# Patient Record
Sex: Female | Born: 1970 | Race: White | Hispanic: No | Marital: Married | Smoking: Current some day smoker
Health system: Southern US, Community
[De-identification: ages and names within clinical notes are randomized; demographics above are authoritative.]

## PROBLEM LIST (undated history)

## (undated) DIAGNOSIS — F329 Major depressive disorder, single episode, unspecified: Secondary | ICD-10-CM

## (undated) DIAGNOSIS — M1990 Unspecified osteoarthritis, unspecified site: Secondary | ICD-10-CM

## (undated) DIAGNOSIS — M722 Plantar fascial fibromatosis: Secondary | ICD-10-CM

## (undated) DIAGNOSIS — I10 Essential (primary) hypertension: Secondary | ICD-10-CM

## (undated) DIAGNOSIS — F32A Depression, unspecified: Secondary | ICD-10-CM

## (undated) DIAGNOSIS — F419 Anxiety disorder, unspecified: Secondary | ICD-10-CM

## (undated) HISTORY — DX: Major depressive disorder, single episode, unspecified: F32.9

## (undated) HISTORY — DX: Depression, unspecified: F32.A

## (undated) HISTORY — DX: Plantar fascial fibromatosis: M72.2

## (undated) HISTORY — DX: Anxiety disorder, unspecified: F41.9

## (undated) HISTORY — DX: Unspecified osteoarthritis, unspecified site: M19.90

## (undated) HISTORY — PX: DILATION AND CURETTAGE OF UTERUS: SHX78

## (undated) HISTORY — DX: Essential (primary) hypertension: I10

---

## 2010-11-06 ENCOUNTER — Ambulatory Visit: Payer: Self-pay | Admitting: Family Medicine

## 2010-11-29 ENCOUNTER — Ambulatory Visit: Payer: Self-pay | Admitting: Otolaryngology

## 2011-05-03 ENCOUNTER — Emergency Department: Payer: Self-pay | Admitting: Unknown Physician Specialty

## 2011-09-24 ENCOUNTER — Encounter: Payer: Self-pay | Admitting: Family Medicine

## 2011-09-24 DIAGNOSIS — Z01419 Encounter for gynecological examination (general) (routine) without abnormal findings: Secondary | ICD-10-CM

## 2012-07-20 DIAGNOSIS — I10 Essential (primary) hypertension: Secondary | ICD-10-CM | POA: Insufficient documentation

## 2012-07-20 DIAGNOSIS — E042 Nontoxic multinodular goiter: Secondary | ICD-10-CM | POA: Insufficient documentation

## 2012-07-20 DIAGNOSIS — R946 Abnormal results of thyroid function studies: Secondary | ICD-10-CM | POA: Insufficient documentation

## 2012-07-20 DIAGNOSIS — K219 Gastro-esophageal reflux disease without esophagitis: Secondary | ICD-10-CM | POA: Insufficient documentation

## 2012-09-04 ENCOUNTER — Emergency Department: Payer: Self-pay | Admitting: Emergency Medicine

## 2012-09-06 LAB — BETA STREP CULTURE(ARMC)

## 2015-03-02 ENCOUNTER — Other Ambulatory Visit: Payer: Self-pay | Admitting: Internal Medicine

## 2015-03-02 DIAGNOSIS — R059 Cough, unspecified: Secondary | ICD-10-CM

## 2015-03-02 DIAGNOSIS — R071 Chest pain on breathing: Secondary | ICD-10-CM

## 2015-03-02 DIAGNOSIS — R05 Cough: Secondary | ICD-10-CM

## 2015-03-07 ENCOUNTER — Other Ambulatory Visit: Payer: Self-pay

## 2015-03-09 ENCOUNTER — Inpatient Hospital Stay: Admission: RE | Admit: 2015-03-09 | Payer: Self-pay | Source: Ambulatory Visit

## 2015-08-17 ENCOUNTER — Ambulatory Visit (INDEPENDENT_AMBULATORY_CARE_PROVIDER_SITE_OTHER): Payer: Managed Care, Other (non HMO) | Admitting: Family Medicine

## 2015-08-17 ENCOUNTER — Ambulatory Visit
Admission: RE | Admit: 2015-08-17 | Discharge: 2015-08-17 | Disposition: A | Discharge status: Home / Self Care | Payer: Managed Care, Other (non HMO) | Source: Ambulatory Visit | Attending: Family Medicine | Admitting: Family Medicine

## 2015-08-17 ENCOUNTER — Encounter: Payer: Self-pay | Admitting: Family Medicine

## 2015-08-17 ENCOUNTER — Ambulatory Visit: Payer: Managed Care, Other (non HMO) | Admitting: Family Medicine

## 2015-08-17 VITALS — BP 120/70 | HR 80 | Ht 67.0 in | Wt 199.0 lb

## 2015-08-17 DIAGNOSIS — K219 Gastro-esophageal reflux disease without esophagitis: Secondary | ICD-10-CM | POA: Diagnosis not present

## 2015-08-17 DIAGNOSIS — I10 Essential (primary) hypertension: Secondary | ICD-10-CM

## 2015-08-17 DIAGNOSIS — E042 Nontoxic multinodular goiter: Secondary | ICD-10-CM

## 2015-08-17 DIAGNOSIS — F418 Other specified anxiety disorders: Secondary | ICD-10-CM | POA: Diagnosis not present

## 2015-08-17 DIAGNOSIS — M549 Dorsalgia, unspecified: Secondary | ICD-10-CM

## 2015-08-17 DIAGNOSIS — Z7689 Persons encountering health services in other specified circumstances: Secondary | ICD-10-CM

## 2015-08-17 DIAGNOSIS — F419 Anxiety disorder, unspecified: Secondary | ICD-10-CM

## 2015-08-17 DIAGNOSIS — Z7189 Other specified counseling: Secondary | ICD-10-CM

## 2015-08-17 DIAGNOSIS — G8929 Other chronic pain: Secondary | ICD-10-CM

## 2015-08-17 DIAGNOSIS — M545 Low back pain: Secondary | ICD-10-CM | POA: Diagnosis present

## 2015-08-17 DIAGNOSIS — Z01419 Encounter for gynecological examination (general) (routine) without abnormal findings: Secondary | ICD-10-CM

## 2015-08-17 DIAGNOSIS — F329 Major depressive disorder, single episode, unspecified: Secondary | ICD-10-CM

## 2015-08-17 MED ORDER — MELOXICAM 15 MG PO TABS: 15.0000 mg | ORAL_TABLET | Freq: Every day | ORAL | Status: DC

## 2015-08-17 MED ORDER — HYDROCHLOROTHIAZIDE 25 MG PO TABS: 25.0000 mg | ORAL_TABLET | Freq: Every day | ORAL | Status: DC

## 2015-08-17 MED ORDER — CITALOPRAM HYDROBROMIDE 10 MG PO TABS: 10.0000 mg | ORAL_TABLET | Freq: Every day | ORAL | Status: DC

## 2015-08-17 NOTE — Patient Instructions (Signed)
Duloxetine delayed-release capsules  What is this medicine?  DULOXETINE (doo LOX e teen) is used to treat depression, anxiety, and different types of chronic pain.  This medicine may be used for other purposes; ask your health care provider or pharmacist if you have questions.  What should I tell my health care provider before I take this medicine?  They need to know if you have any of these conditions:  -bipolar disorder or a family history of bipolar disorder  -glaucoma  -kidney disease  -liver disease  -suicidal thoughts or a previous suicide attempt  -taken medicines called MAOIs like Carbex, Eldepryl, Marplan, Nardil, and Parnate within 14 days  -an unusual reaction to duloxetine, other medicines, foods, dyes, or preservatives  -pregnant or trying to get pregnant  -breast-feeding  How should I use this medicine?  Take this medicine by mouth with a glass of water. Follow the directions on the prescription label. Do not cut, crush or chew this medicine. You can take this medicine with or without food. Take your medicine at regular intervals. Do not take your medicine more often than directed. Do not stop taking this medicine suddenly except upon the advice of your doctor. Stopping this medicine too quickly may cause serious side effects or your condition may worsen.  A special MedGuide will be given to you by the pharmacist with each prescription and refill. Be sure to read this information carefully each time.  Talk to your pediatrician regarding the use of this medicine in children. While this drug may be prescribed for children as young as 7 years of age for selected conditions, precautions do apply.  Overdosage: If you think you have taken too much of this medicine contact a poison control center or emergency room at once.  NOTE: This medicine is only for you. Do not share this medicine with others.  What if I miss a dose?  If you miss a dose, take it as soon as you can. If it is almost time for your next  dose, take only that dose. Do not take double or extra doses.  What may interact with this medicine?  Do not take this medicine with any of the following medications:  -certain diet drugs like dexfenfluramine, fenfluramine  -desvenlafaxine  -linezolid  -MAOIs like Azilect, Carbex, Eldepryl, Marplan, Nardil, and Parnate  -methylene blue (intravenous)  -milnacipran  -thioridazine  -venlafaxine  This medicine may also interact with the following medications:  -alcohol  -aspirin and aspirin-like medicines  -certain antibiotics like ciprofloxacin and enoxacin  -certain medicines for blood pressure, heart disease, irregular heart beat  -certain medicines for depression, anxiety, or psychotic disturbances  -certain medicines for migraine headache like almotriptan, eletriptan, frovatriptan, naratriptan, rizatriptan, sumatriptan, zolmitriptan  -certain medicines that treat or prevent blood clots like warfarin, enoxaparin, and dalteparin  -cimetidine  -fentanyl  -lithium  -NSAIDS, medicines for pain and inflammation, like ibuprofen or naproxen  -phentermine  -procarbazine  -sibutramine  -St. John's wort  -theophylline  -tramadol  -tryptophan  This list may not describe all possible interactions. Give your health care provider a list of all the medicines, herbs, non-prescription drugs, or dietary supplements you use. Also tell them if you smoke, drink alcohol, or use illegal drugs. Some items may interact with your medicine.  What should I watch for while using this medicine?  Tell your doctor if your symptoms do not get better or if they get worse. Visit your doctor or health care professional for regular checks on your progress.   Because it may take several weeks to see the full effects of this medicine, it is important to continue your treatment as prescribed by your doctor.  Patients and their families should watch out for new or worsening thoughts of suicide or depression. Also watch out for sudden changes in feelings such  as feeling anxious, agitated, panicky, irritable, hostile, aggressive, impulsive, severely restless, overly excited and hyperactive, or not being able to sleep. If this happens, especially at the beginning of treatment or after a change in dose, call your health care professional.  You may get drowsy or dizzy. Do not drive, use machinery, or do anything that needs mental alertness until you know how this medicine affects you. Do not stand or sit up quickly, especially if you are an older patient. This reduces the risk of dizzy or fainting spells. Alcohol may interfere with the effect of this medicine. Avoid alcoholic drinks.  This medicine can cause an increase in blood pressure. This medicine can also cause a sudden drop in your blood pressure, which may make you feel faint and increase the chance of a fall. These effects are most common when you first start the medicine or when the dose is increased, or during use of other medicines that can cause a sudden drop in blood pressure. Check with your doctor for instructions on monitoring your blood pressure while taking this medicine.  Your mouth may get dry. Chewing sugarless gum or sucking hard candy, and drinking plenty of water may help. Contact your doctor if the problem does not go away or is severe.  What side effects may I notice from receiving this medicine?  Side effects that you should report to your doctor or health care professional as soon as possible:  -allergic reactions like skin rash, itching or hives, swelling of the face, lips, or tongue  -changes in blood pressure  -confusion  -dark urine  -dizziness  -fast talking and excited feelings or actions that are out of control  -fast, irregular heartbeat  -fever  -general ill feeling or flu-like symptoms  -hallucination, loss of contact with reality  -light-colored stools  -loss of balance or coordination  -redness, blistering, peeling or loosening of the skin, including inside the mouth  -right upper  belly pain  -seizures  -suicidal thoughts or other mood changes  -trouble concentrating  -trouble passing urine or change in the amount of urine  -unusual bleeding or bruising  -unusually weak or tired  -yellowing of the eyes or skin  Side effects that usually do not require medical attention (report to your doctor or health care professional if they continue or are bothersome):  -blurred vision  -change in appetite  -change in sex drive or performance  -headache  -increased sweating  -nausea  This list may not describe all possible side effects. Call your doctor for medical advice about side effects. You may report side effects to FDA at 1-800-FDA-1088.  Where should I keep my medicine?  Keep out of the reach of children.  Store at room temperature between 20 and 25 degrees C (68 to 77 degrees F). Throw away any unused medicine after the expiration date.  NOTE: This sheet is a summary. It may not cover all possible information. If you have questions about this medicine, talk to your doctor, pharmacist, or health care provider.     © 2016, [employer]. (2013-03-30 16:28:32)

## 2015-08-17 NOTE — Progress Notes (Signed)
Name: Kara Cisneros   MRN: [id number]    DOB: 04/03/1971   Date:08/17/2015       Progress Note  Subjective  Chief Complaint  Chief Complaint  Patient presents with  . Establish Care    needs appt to OBGYN  . Depression    referral to Psychiatry    HPI Comments: Patient presents for establishment of care.  Depression        The patient presents with no depression.  This is a chronic problem.  The current episode started more than 1 year ago.   The onset quality is gradual.   The problem occurs intermittently.  The problem has been waxing and waning since onset.  Associated symptoms include no decreased concentration, no fatigue, no helplessness, no hopelessness, does not have insomnia, not irritable, no restlessness, no decreased interest, no appetite change, no body aches, no myalgias, no headaches, no indigestion, not sad and no suicidal ideas.  Past treatments include SSRIs - Selective serotonin reuptake inhibitors.  Compliance with treatment is good.  Past medical history includes chronic pain.     Pertinent negatives include no chronic fatigue syndrome, no fibromyalgia, no hypothyroidism, no thyroid problem, no chronic illness, no recent illness, no life-threatening condition, no physical disability, no terminal illness, no recent psychiatric admission, no Alzheimer's disease, no brain trauma, no dementia, no anxiety, no bipolar disorder, no eating disorder, no depression, no mental health disorder, no obsessive-compulsive disorder, no post-traumatic stress disorder, no schizophrenia, no suicide attempts and no head trauma. Hypertension This is a chronic problem. The current episode started more than 1 year ago. The problem has been gradually improving since onset. The problem is controlled. Associated symptoms include chest pain. Pertinent negatives include no anxiety, blurred vision, headaches, malaise/fatigue, neck pain, palpitations or shortness of breath. Agents associated with  hypertension include oral contraceptives and NSAIDs. There are no known risk factors for coronary artery disease. Past treatments include diuretics. The current treatment provides mild improvement. There are no compliance problems.  There is no history of angina, kidney disease, CAD/MI, CVA, heart failure, left ventricular hypertrophy, PVD, renovascular disease, retinopathy or a thyroid problem. There is no history of chronic renal disease or a hypertension causing med.  Back Pain This is a chronic problem. The current episode started more than 1 year ago. The problem has been waxing and waning since onset. The pain is present in the lumbar spine. The quality of the pain is described as aching. The pain radiates to the right knee, right thigh and right foot. The pain is moderate. The symptoms are aggravated by sitting. Associated symptoms include chest pain, numbness and tingling. Pertinent negatives include no abdominal pain, bladder incontinence, bowel incontinence, dysuria, fever, headaches, paresis, weakness or weight loss. She has tried NSAIDs for the symptoms. The treatment provided mild relief.    No problem-specific assessment & plan notes found for this encounter.   Past Medical History  Diagnosis Date  . Hypertension   . Depression   . Anxiety   . Arthritis   . Plantar fascia syndrome     History reviewed. No pertinent past surgical history.  Family History  Problem Relation Age of Onset  . Hypertension Maternal Grandmother   . Cancer Maternal Grandfather     Social History   Social History  . Marital Status: Single    Spouse Name: N/A  . Number of Children: N/A  . Years of Education: N/A   Occupational History  . Not on file.  Social History Main Topics  . Smoking status: Former Smoker    Quit date: 04/23/2007  . Smokeless tobacco: Not on file  . Alcohol Use: 0.0 oz/week    0 Standard drinks or equivalent per week  . Drug Use: No  . Sexual Activity: Yes    Other Topics Concern  . Not on file   Social History Narrative  . No narrative on file    No Known Allergies   Review of Systems  Constitutional: Negative for fever, chills, weight loss, malaise/fatigue, appetite change and fatigue.  HENT: Negative for ear discharge, ear pain and sore throat.   Eyes: Negative for blurred vision.  Respiratory: Negative for cough, sputum production, shortness of breath and wheezing.   Cardiovascular: Positive for chest pain. Negative for palpitations and leg swelling.  Gastrointestinal: Negative for heartburn, nausea, abdominal pain, diarrhea, constipation, blood in stool, melena and bowel incontinence.  Genitourinary: Negative for bladder incontinence, dysuria, urgency, frequency and hematuria.  Musculoskeletal: Positive for back pain. Negative for myalgias, joint pain and neck pain.  Skin: Negative for rash.  Neurological: Positive for tingling and numbness. Negative for dizziness, sensory change, focal weakness, weakness and headaches.  Endo/Heme/Allergies: Negative for environmental allergies and polydipsia. Does not bruise/bleed easily.  Psychiatric/Behavioral: Positive for depression. Negative for suicidal ideas and decreased concentration. The patient is nervous/anxious. The patient does not have insomnia.      Objective  Filed Vitals:   08/17/15 1442  BP: 120/70  Pulse: 80  Height: 5\' 7"  (1.702 m)  Weight: 199 lb (90.266 kg)    Physical Exam  Constitutional: She is well-developed, well-nourished, and in no distress. She is not irritable. No distress.  HENT:  Head: Normocephalic and atraumatic.  Right Ear: External ear normal.  Left Ear: External ear normal.  Nose: Nose normal.  Mouth/Throat: Oropharynx is clear and moist.  Eyes: Conjunctivae and EOM are normal. Pupils are equal, round, and reactive to light. Right eye exhibits no discharge. Left eye exhibits no discharge.  Neck: Normal range of motion. Neck supple. No JVD  present. No thyromegaly present.  Cardiovascular: Normal rate, regular rhythm, normal heart sounds and intact distal pulses.  Exam reveals no gallop and no friction rub.   No murmur heard. Pulmonary/Chest: Effort normal and breath sounds normal.  Abdominal: Soft. Bowel sounds are normal. She exhibits no mass. There is no tenderness. There is no guarding.  Musculoskeletal: Normal range of motion. She exhibits no edema.  Lymphadenopathy:    She has no cervical adenopathy.  Neurological: She is alert. She has normal reflexes.  Skin: Skin is warm and dry. She is not diaphoretic.  Psychiatric: Mood and affect normal.  Nursing note and vitals reviewed.     Assessment & Plan  Problem List Items Addressed This Visit      Cardiovascular and Mediastinum   BP (high blood pressure)   Relevant Medications   hydrochlorothiazide (HYDRODIURIL) 25 MG tablet     Digestive   Acid reflux     Endocrine   Multinodular goiter    Other Visit Diagnoses    Encounter to establish care with new doctor    -  Primary    Anxiety and depression        Relevant Medications    citalopram (CELEXA) 10 MG tablet    Other Relevant Orders    Ambulatory referral to Psychiatry    Chronic back pain        Relevant Medications    meloxicam (MOBIC) 15 MG  tablet    Other Relevant Orders    DG Lumbar Spine Complete    Encounter for gynecological examination        Relevant Orders    Ambulatory referral to Obstetrics / Gynecology         Dr. Otilio Miu [facility]  08/17/2015

## 2015-08-18 ENCOUNTER — Telehealth: Payer: Self-pay

## 2015-08-18 NOTE — Telephone Encounter (Signed)
pt needs rx for celexa sent to [facility] pt is out.

## 2015-08-23 NOTE — Telephone Encounter (Signed)
Is she our pt

## 2015-09-05 ENCOUNTER — Ambulatory Visit: Payer: Managed Care, Other (non HMO) | Admitting: Psychiatry

## 2015-11-20 ENCOUNTER — Ambulatory Visit (INDEPENDENT_AMBULATORY_CARE_PROVIDER_SITE_OTHER): Payer: Managed Care, Other (non HMO) | Admitting: Family Medicine

## 2015-11-20 ENCOUNTER — Encounter: Payer: Self-pay | Admitting: Family Medicine

## 2015-11-20 VITALS — BP 120/84 | HR 88 | Ht 67.0 in | Wt 196.0 lb

## 2015-11-20 DIAGNOSIS — F32A Depression, unspecified: Secondary | ICD-10-CM

## 2015-11-20 DIAGNOSIS — F329 Major depressive disorder, single episode, unspecified: Secondary | ICD-10-CM | POA: Diagnosis not present

## 2015-11-20 DIAGNOSIS — I10 Essential (primary) hypertension: Secondary | ICD-10-CM

## 2015-11-20 MED ORDER — DULOXETINE HCL 30 MG PO CPEP
30.0000 mg | ORAL_CAPSULE | Freq: Every day | ORAL | 3 refills | Status: DC
Start: 2015-11-20 — End: 2015-12-13

## 2015-11-20 NOTE — Progress Notes (Signed)
Name: Kara Cisneros   MRN: [id number]    DOB: [birth year]   Date:11/20/2015       Progress Note  Subjective  Chief Complaint  Chief Complaint  Patient presents with  . Hypertension  . Depression    has had to "double up on med for break through"- taking 20mg  x 2 months    Hypertension  This is a chronic problem. The current episode started more than 1 year ago. The problem has been gradually improving since onset. The problem is controlled. Pertinent negatives include no anxiety, blurred vision, chest pain, headaches, malaise/fatigue, neck pain, orthopnea, palpitations, peripheral edema, PND, shortness of breath or sweats. There are no associated agents to hypertension. There are no known risk factors for coronary artery disease. Past treatments include nothing (previously on HCTZ). The current treatment provides mild improvement. There are no compliance problems.  There is no history of angina, kidney disease, CAD/MI, CVA, heart failure, left ventricular hypertrophy, PVD, renovascular disease or retinopathy. There is no history of chronic renal disease or a hypertension causing med.  Depression         This is a chronic problem.  The current episode started more than 1 year ago.   The onset quality is gradual.   The problem occurs intermittently.  The problem has been waxing and waning since onset.  Associated symptoms include decreased concentration, fatigue, insomnia, irritable, decreased interest, indigestion and sad.  Associated symptoms include no helplessness, no hopelessness, no restlessness, no appetite change, no body aches, no headaches and no suicidal ideas.     The symptoms are aggravated by nothing.  Past treatments include SSRIs - Selective serotonin reuptake inhibitors.  Compliance with treatment is good.  Previous treatment provided mild relief.   Pertinent negatives include no anxiety.   No problem-specific Assessment & Plan notes found for this encounter.   Past Medical  History:  Diagnosis Date  . Anxiety   . Arthritis   . Depression   . Hypertension   . Plantar fascia syndrome     History reviewed. No pertinent surgical history.  Family History  Problem Relation Age of Onset  . Hypertension Maternal Grandmother   . Cancer Maternal Grandfather     Social History   Social History  . Marital status: Married    Spouse name: N/A  . Number of children: N/A  . Years of education: N/A   Occupational History  . Not on file.   Social History Main Topics  . Smoking status: Former Smoker    Quit date: 04/23/2007  . Smokeless tobacco: Never Used  . Alcohol use 0.0 oz/week  . Drug use: No  . Sexual activity: Yes   Other Topics Concern  . Not on file   Social History Narrative  . No narrative on file    No Known Allergies   Review of Systems  Constitutional: Positive for fatigue. Negative for appetite change and malaise/fatigue.  Eyes: Negative for blurred vision.  Respiratory: Negative for shortness of breath.   Cardiovascular: Negative for chest pain, palpitations, orthopnea and PND.  Musculoskeletal: Negative for neck pain.  Neurological: Negative for headaches.  Psychiatric/Behavioral: Positive for decreased concentration and depression. Negative for suicidal ideas. The patient has insomnia.      Objective  Vitals:   11/20/15 1438  BP: 120/84  Pulse: 88  Weight: 196 lb (88.9 kg)  Height: 5\' 7"  (1.702 m)    Physical Exam  Constitutional: She is well-developed, well-nourished, and in no distress. She  is irritable. No distress.  HENT:  Head: Normocephalic and atraumatic.  Right Ear: External ear normal.  Left Ear: External ear normal.  Nose: Nose normal.  Mouth/Throat: Oropharynx is clear and moist.  Eyes: Conjunctivae and EOM are normal. Pupils are equal, round, and reactive to light. Right eye exhibits no discharge. Left eye exhibits no discharge.  Neck: Normal range of motion. Neck supple. No JVD present. No  thyromegaly present.  Cardiovascular: Normal rate, regular rhythm, normal heart sounds and intact distal pulses.  Exam reveals no gallop and no friction rub.   No murmur heard. Pulmonary/Chest: Effort normal and breath sounds normal.  Abdominal: Soft. Bowel sounds are normal. She exhibits no mass. There is no tenderness. There is no guarding.  Musculoskeletal: Normal range of motion. She exhibits no edema.  Lymphadenopathy:    She has no cervical adenopathy.  Neurological: She is alert.  Skin: Skin is warm and dry. She is not diaphoretic.  Psychiatric: Mood and affect normal.  Nursing note and vitals reviewed.     Assessment & Plan  Problem List Items Addressed This Visit    None    Visit Diagnoses   None.       Dr. Mac[facility]  11/20/15

## 2015-11-20 NOTE — Patient Instructions (Signed)

## 2015-12-13 ENCOUNTER — Other Ambulatory Visit: Payer: Self-pay

## 2015-12-13 DIAGNOSIS — F329 Major depressive disorder, single episode, unspecified: Secondary | ICD-10-CM

## 2015-12-13 DIAGNOSIS — F32A Depression, unspecified: Secondary | ICD-10-CM

## 2015-12-13 MED ORDER — DULOXETINE HCL 60 MG PO CPEP: 60.0000 mg | ORAL_CAPSULE | Freq: Every day | ORAL | 3 refills | Status: DC

## 2016-01-09 ENCOUNTER — Other Ambulatory Visit: Payer: Self-pay

## 2016-01-11 ENCOUNTER — Other Ambulatory Visit: Payer: Self-pay

## 2016-01-15 ENCOUNTER — Ambulatory Visit (INDEPENDENT_AMBULATORY_CARE_PROVIDER_SITE_OTHER): Payer: Managed Care, Other (non HMO) | Admitting: Family Medicine

## 2016-01-15 ENCOUNTER — Encounter: Payer: Self-pay | Admitting: Family Medicine

## 2016-01-15 VITALS — BP 150/100 | HR 70 | Ht 67.0 in | Wt 190.0 lb

## 2016-01-15 DIAGNOSIS — E663 Overweight: Secondary | ICD-10-CM

## 2016-01-15 DIAGNOSIS — F32A Depression, unspecified: Secondary | ICD-10-CM

## 2016-01-15 DIAGNOSIS — Z8639 Personal history of other endocrine, nutritional and metabolic disease: Secondary | ICD-10-CM | POA: Diagnosis not present

## 2016-01-15 DIAGNOSIS — E785 Hyperlipidemia, unspecified: Secondary | ICD-10-CM

## 2016-01-15 DIAGNOSIS — I10 Essential (primary) hypertension: Secondary | ICD-10-CM

## 2016-01-15 DIAGNOSIS — F329 Major depressive disorder, single episode, unspecified: Secondary | ICD-10-CM

## 2016-01-15 MED ORDER — DULOXETINE HCL 60 MG PO CPEP: 60.0000 mg | ORAL_CAPSULE | Freq: Every day | ORAL | 3 refills | Status: DC

## 2016-01-15 MED ORDER — LOSARTAN POTASSIUM-HCTZ 50-12.5 MG PO TABS: 1.0000 | ORAL_TABLET | Freq: Every day | ORAL | 3 refills | Status: DC

## 2016-01-15 NOTE — Progress Notes (Signed)
Name: Kara Cisneros   MRN: [id number]    DOB: Jul 09, 1970   Date:01/15/2016       Progress Note  Subjective  Chief Complaint  Chief Complaint  Patient presents with  . Depression    been doing good on med  . Hypertension    started back on B/P med x 2 days after going to a health fare at husband's job and b/p was 150/100    Depression       The patient presents with depression.  This is a chronic problem.  The current episode started more than 1 year ago.   The onset quality is gradual.   The problem occurs constantly.  The problem has been gradually worsening since onset.  Associated symptoms include no decreased concentration, no fatigue, no helplessness, no hopelessness, does not have insomnia, not irritable, no restlessness, no decreased interest, no appetite change, no body aches, no myalgias, no headaches, no indigestion, not sad and no suicidal ideas.     The symptoms are aggravated by nothing.  Past treatments include SNRIs - Serotonin and norepinephrine reuptake inhibitors.  Compliance with treatment is good.  Past compliance problems include difficulty understanding directions.  Previous treatment provided mild relief.  Past medical history includes depression.     Pertinent negatives include no hypothyroidism and no anxiety. Hypertension  This is a chronic problem. The current episode started more than 1 year ago. The problem has been waxing and waning since onset. The problem is uncontrolled. Pertinent negatives include no anxiety, blurred vision, chest pain, headaches, malaise/fatigue, neck pain, orthopnea, palpitations, peripheral edema, PND, shortness of breath or sweats. There are no associated agents to hypertension. Past treatments include diuretics. There are no compliance problems.  There is no history of angina, kidney disease, CAD/MI, CVA, heart failure, left ventricular hypertrophy, PVD, renovascular disease or retinopathy. There is no history of chronic renal disease or a  hypertension causing med.  Hyperlipidemia  This is a chronic problem. The current episode started more than 1 year ago. The problem is uncontrolled. Recent lipid tests were reviewed and are normal. Exacerbating diseases include obesity. She has no history of chronic renal disease, diabetes, hypothyroidism, liver disease or nephrotic syndrome. Factors aggravating her hyperlipidemia include thiazides. Pertinent negatives include no chest pain, focal weakness, myalgias or shortness of breath. Current antihyperlipidemic treatment includes statins. The current treatment provides no improvement of lipids. Compliance problems include adherence to diet.  Risk factors for coronary artery disease include dyslipidemia, hypertension and stress.    No problem-specific Assessment & Plan notes found for this encounter.   Past Medical History:  Diagnosis Date  . Anxiety   . Arthritis   . Depression   . Hypertension   . Plantar fascia syndrome     History reviewed. No pertinent surgical history.  Family History  Problem Relation Age of Onset  . Hypertension Maternal Grandmother   . Cancer Maternal Grandfather     Social History   Social History  . Marital status: Married    Spouse name: N/A  . Number of children: N/A  . Years of education: N/A   Occupational History  . Not on file.   Social History Main Topics  . Smoking status: Former Smoker    Quit date: 04/23/2007  . Smokeless tobacco: Never Used  . Alcohol use 0.0 oz/week  . Drug use: No  . Sexual activity: Yes   Other Topics Concern  . Not on file   Social History Narrative  .  No narrative on file    No Known Allergies   Review of Systems  Constitutional: Negative for appetite change, chills, fatigue, fever, malaise/fatigue and weight loss.  HENT: Negative for ear discharge, ear pain and sore throat.   Eyes: Negative for blurred vision.  Respiratory: Negative for cough, sputum production, shortness of breath and wheezing.    Cardiovascular: Negative for chest pain, palpitations, orthopnea, leg swelling and PND.  Gastrointestinal: Negative for abdominal pain, blood in stool, constipation, diarrhea, heartburn, melena and nausea.  Genitourinary: Negative for dysuria, frequency, hematuria and urgency.  Musculoskeletal: Negative for back pain, joint pain, myalgias and neck pain.  Skin: Negative for rash.  Neurological: Negative for dizziness, tingling, sensory change, focal weakness and headaches.  Endo/Heme/Allergies: Negative for environmental allergies and polydipsia. Does not bruise/bleed easily.  Psychiatric/Behavioral: Positive for depression. Negative for decreased concentration and suicidal ideas. The patient is not nervous/anxious and does not have insomnia.      Objective  Vitals:   01/15/16 0758  BP: (!) 150/100  Pulse: 70  Weight: 190 lb (86.2 kg)  Height: 5\' 7"  (1.702 m)    Physical Exam  Constitutional: She is well-developed, well-nourished, and in no distress. She is not irritable. No distress.  HENT:  Head: Normocephalic and atraumatic.  Right Ear: External ear normal.  Left Ear: External ear normal.  Nose: Nose normal.  Mouth/Throat: Oropharynx is clear and moist.  Eyes: Conjunctivae and EOM are normal. Pupils are equal, round, and reactive to light. Right eye exhibits no discharge. Left eye exhibits no discharge.  Neck: Normal range of motion. Neck supple. No JVD present. No thyromegaly present.  Cardiovascular: Normal rate, regular rhythm, normal heart sounds and intact distal pulses.  Exam reveals no gallop and no friction rub.   No murmur heard. Pulmonary/Chest: Effort normal and breath sounds normal.  Abdominal: Soft. Bowel sounds are normal. She exhibits no mass. There is no tenderness. There is no guarding.  Musculoskeletal: Normal range of motion. She exhibits no edema.  Lymphadenopathy:    She has no cervical adenopathy.  Neurological: She is alert.  Skin: Skin is warm and  dry. She is not diaphoretic.  Psychiatric: Mood and affect normal.  Nursing note and vitals reviewed.     Assessment & Plan  Problem List Items Addressed This Visit      Cardiovascular and Mediastinum   BP (high blood pressure) - Primary   Relevant Medications   losartan-hydrochlorothiazide (HYZAAR) 50-12.5 MG tablet   Other Relevant Orders   Renal Function Panel    Other Visit Diagnoses    Hyperlipidemia       Relevant Medications   losartan-hydrochlorothiazide (HYZAAR) 50-12.5 MG tablet   Other Relevant Orders   Lipid Profile   Overweight (BMI 25.0-29.9)       Relevant Orders   Lipid Profile   TSH   History of hypothyroidism       Relevant Orders   TSH   Depression       Relevant Medications   DULoxetine (CYMBALTA) 60 MG capsule        Dr. [facility]  01/15/16

## 2016-01-15 NOTE — Patient Instructions (Signed)

## 2016-01-16 LAB — LIPID PANEL
Chol/HDL Ratio: 7.6 ratio units — ABNORMAL HIGH (ref 0.0–4.4)
Cholesterol, Total: 236 mg/dL — ABNORMAL HIGH (ref 100–199)
HDL: 31 mg/dL — AB (ref >39)
LDL Calculated: 125 mg/dL — ABNORMAL HIGH (ref 0–99)
TRIGLYCERIDES: 398 mg/dL — AB (ref 0–149)
VLDL Cholesterol Cal: 80 mg/dL — ABNORMAL HIGH (ref 5–40)

## 2016-01-16 LAB — RENAL FUNCTION PANEL
Albumin: 4.9 g/dL (ref 3.5–5.5)
BUN / CREAT RATIO: 12 (ref 9–23)
BUN: 9 mg/dL (ref 6–24)
CO2: 25 mmol/L (ref 18–29)
CREATININE: 0.76 mg/dL (ref 0.57–1.00)
Calcium: 10.3 mg/dL — ABNORMAL HIGH (ref 8.7–10.2)
Chloride: 97 mmol/L (ref 96–106)
GFR, EST AFRICAN AMERICAN: 111 mL/min/{1.73_m2} (ref >59)
GFR, EST NON AFRICAN AMERICAN: 96 mL/min/{1.73_m2} (ref >59)
Glucose: 113 mg/dL — ABNORMAL HIGH (ref 65–99)
Phosphorus: 4 mg/dL (ref 2.5–4.5)
Potassium: 4.2 mmol/L (ref 3.5–5.2)
SODIUM: 141 mmol/L (ref 134–144)

## 2016-01-16 LAB — TSH: TSH: 0.584 u[IU]/mL (ref 0.450–4.500)

## 2016-01-22 ENCOUNTER — Other Ambulatory Visit: Payer: Self-pay

## 2016-02-21 ENCOUNTER — Ambulatory Visit
Admission: EM | Admit: 2016-02-21 | Discharge: 2016-02-21 | Disposition: A | Discharge status: Home / Self Care | Payer: Managed Care, Other (non HMO) | Attending: Family Medicine | Admitting: Family Medicine

## 2016-02-21 DIAGNOSIS — J0110 Acute frontal sinusitis, unspecified: Secondary | ICD-10-CM | POA: Diagnosis not present

## 2016-02-21 DIAGNOSIS — J40 Bronchitis, not specified as acute or chronic: Secondary | ICD-10-CM

## 2016-02-21 MED ORDER — BENZONATATE 100 MG PO CAPS: 100.0000 mg | ORAL_CAPSULE | Freq: Three times a day (TID) | ORAL | 0 refills | Status: DC | PRN

## 2016-02-21 MED ORDER — ALBUTEROL SULFATE HFA 108 (90 BASE) MCG/ACT IN AERS: 2.0000 | INHALATION_SPRAY | RESPIRATORY_TRACT | 0 refills | Status: DC | PRN

## 2016-02-21 MED ORDER — GUAIFENESIN-CODEINE 100-10 MG/5ML PO SOLN: 5.0000 mL | Freq: Every evening | ORAL | 0 refills | Status: DC | PRN

## 2016-02-21 MED ORDER — AMOXICILLIN-POT CLAVULANATE 875-125 MG PO TABS: 1.0000 | ORAL_TABLET | Freq: Two times a day (BID) | ORAL | 0 refills | Status: DC

## 2016-02-21 NOTE — ED Triage Notes (Signed)
Pt c/o pain and pressure in her chest and sinus cavity. She has had a cold but she isnt getting any better. Her chest congestion is getting worse.

## 2016-02-21 NOTE — Discharge Instructions (Signed)
Take medication as prescribed. Rest. Drink plenty of fluids.  ° °Follow up with your primary care physician this week as needed. Return to Urgent care for new or worsening concerns.  ° °

## 2016-02-21 NOTE — ED Provider Notes (Signed)
[name] ____________________________________________  Time seen: Approximately 4:33 PM  I have reviewed the triage vital signs and the nursing notes.   HISTORY  Chief Complaint Nasal Congestion   HPI Kara Cisneros is a 45 y.o. female presents with complaints of 2 weeks of runny nose, nasal congestion and cough. Patient reports she does have some chest congestion and cough, reports cough is occasionally productive. Reports blowing her nose and a very thick greenish mucus. Patient reports in the last few days she has had sinus pressure and sinus headaches. Reports her son rate with similar symptoms. Denies fevers. Denies chest pain, shortness breath, chest pain with deep breath. Denies abdominal pain, extremity pain or swelling, dizziness, vision changes or rash. Reports continues to eat and drink well. Reports symptoms of an unresolving over-the-counter cough and congestion medications.  Denies pain at this time. Denies recent sickness. Denies recent antibiotic use.  No LMP recorded. Patient has had an implant. Denies pregnancy. Reports last initial 2 weeks ago. Otilio Miu, MD: PCP   Past Medical History:  Diagnosis Date  . Anxiety   . Arthritis   . Depression   . Hypertension   . Plantar fascia syndrome     Patient Active Problem List   Diagnosis Date Noted  . Abnormal finding on thyroid function test 07/20/2012  . Acid reflux 07/20/2012  . BP (high blood pressure) 07/20/2012  . Multinodular goiter 07/20/2012    History reviewed. No pertinent surgical history.  Current Outpatient Rx  . Order #: [id number] Class: Normal  . Order #: [id number] Class: Historical Med  . Order #: [id number] Class: Normal  . Order #: [id number] Class: Normal  . Order #: [id number] Class: Normal  . Order #: [id number] Class: Normal  . Order #: [id number] Class: Print    No current facility-administered medications for this encounter.   Current Outpatient Prescriptions:  .   DULoxetine (CYMBALTA) 60 MG capsule, Take 1 capsule (60 mg total) by mouth daily., Disp: 30 capsule, Rfl: 3 .  etonogestrel (NEXPLANON) 68 MG IMPL implant, Inject into the skin., Disp: , Rfl:  .  losartan-hydrochlorothiazide (HYZAAR) 50-12.5 MG tablet, Take 1 tablet by mouth daily., Disp: 30 tablet, Rfl: 3 .  albuterol (PROVENTIL HFA;VENTOLIN HFA) 108 (90 Base) MCG/ACT inhaler, Inhale 2 puffs into the lungs every 4 (four) hours as needed., Disp: 1 Inhaler, Rfl: 0 .  amoxicillin-clavulanate (AUGMENTIN) 875-125 MG tablet, Take 1 tablet by mouth every 12 (twelve) hours., Disp: 20 tablet, Rfl: 0 .  benzonatate (TESSALON PERLES) 100 MG capsule, Take 1 capsule (100 mg total) by mouth 3 (three) times daily as needed., Disp: 15 capsule, Rfl: 0 .  guaiFENesin-codeine 100-10 MG/5ML syrup, Take 5 mLs by mouth at bedtime as needed for cough. Do not drive or operate machinery as can cause drowsiness., Disp: 75 mL, Rfl: 0  Allergies Review of patient's allergies indicates no known allergies.  Family History  Problem Relation Age of Onset  . Hypertension Maternal Grandmother   . Cancer Maternal Grandfather     Social History Social History  Substance Use Topics  . Smoking status: Former Smoker    Quit date: 04/23/2007  . Smokeless tobacco: Never Used  . Alcohol use 0.0 oz/week    Review of Systems Constitutional: No fever/chills Eyes: No visual changes. ENT:As above. Cardiovascular: Denies chest pain. Respiratory: Denies shortness of breath. Gastrointestinal: No abdominal pain.  No nausea, no vomiting.  No diarrhea.  No constipation. Genitourinary: Negative for dysuria. Musculoskeletal: Negative for back pain. Skin: Negative for rash. Neurological: Negative  for headaches, focal weakness or numbness.  10-point ROS otherwise negative.  ____________________________________________   PHYSICAL EXAM:  VITAL SIGNS: ED Triage Vitals  Enc Vitals Group     BP 02/21/16 1558 132/82     Pulse Rate  02/21/16 1558 86     Resp 02/21/16 1558 20     Temp 02/21/16 1558 98.4 F (36.9 C)     Temp Source 02/21/16 1558 Oral     SpO2 02/21/16 1558 99 %     Weight 02/21/16 1600 190 lb (86.2 kg)     Height 02/21/16 1600 5\' 7"  (1.702 m)     Head Circumference --      Peak Flow --      Pain Score 02/21/16 1601 4     Pain Loc --      Pain Edu? --      Excl. in [place]? --    Constitutional: Alert and oriented. Well appearing and in no acute distress. Eyes: Conjunctivae are normal. PERRL. EOMI. Head: Atraumatic.Mild to moderate tenderness to palpation bilateral frontal and mild bilateral maxillary sinuses. No swelling. No erythema.   Ears: no erythema, normal TMs bilaterally.   Nose: nasal congestion with bilateral nasal turbinate erythema and edema.   Mouth/Throat: Mucous membranes are moist.  Oropharynx non-erythematous.No tonsillar swelling or exudate.  Neck: No stridor.  No cervical spine tenderness to palpation. Hematological/Lymphatic/Immunilogical: No cervical lymphadenopathy. Cardiovascular: Normal rate, regular rhythm. Grossly normal heart sounds.  Good peripheral circulation. Respiratory: Normal respiratory effort.  No retractions. Lungs CTAB. No wheezes, rales or rhonchi. Good air movement. Dry intermittent cough noted in room. Slight bronchospasm wheeze noted with cough. No focal area of consolidation auscultated. Gastrointestinal: Soft and nontender.  No CVA tenderness. Musculoskeletal: No lower or upper extremity tenderness nor edema.  Bilateral pedal pulses equal and easily palpated. No cervical, thoracic or lumbar tenderness to palpation.  Neurologic:  Normal speech and language. No gross focal neurologic deficits are appreciated. No gait instability. Skin:  Skin is warm, dry and intact. No rash noted. Psychiatric: Mood and affect are normal. Speech and behavior are normal.  ___________________________________________   LABS (all labs ordered are listed, but only abnormal results  are displayed)  Labs Reviewed - No data to display ____________________________________________  RADIOLOGY  No results found. ____________________________________________   PROCEDURES Procedures    INITIAL IMPRESSION / ASSESSMENT AND PLAN / ED COURSE  Pertinent labs & imaging results that were available during my care of the patient were reviewed by me and considered in my medical decision making (see chart for details).  Well-appearing patient. No acute distress. Suspect sinusitis and bronchitis. Will treat patient with oral Augmentin, when necessary Tessalon Perles and when necessary guaifenesin with codeine at night. Albuterol inhaler as needed. Encourage rest, fluids and supportive care. Encourage PCP follow up as needed.  Discussed follow up with Primary care physician this week. Discussed follow up and return parameters including no resolution or any worsening concerns. Patient verbalized understanding and agreed to plan.   ____________________________________________   FINAL CLINICAL IMPRESSION(S) / ED DIAGNOSES  Final diagnoses:  Acute frontal sinusitis, recurrence not specified  Bronchitis     Discharge Medication List as of 02/21/2016  4:37 PM    START taking these medications   Details  albuterol (PROVENTIL HFA;VENTOLIN HFA) 108 (90 Base) MCG/ACT inhaler Inhale 2 puffs into the lungs every 4 (four) hours as needed., Starting Wed 02/21/2016, Normal    amoxicillin-clavulanate (AUGMENTIN) 875-125 MG tablet Take 1 tablet by mouth every 12 (  twelve) hours., Starting Wed 02/21/2016, Normal    benzonatate (TESSALON PERLES) 100 MG capsule Take 1 capsule (100 mg total) by mouth 3 (three) times daily as needed., Starting Wed 02/21/2016, Normal    guaiFENesin-codeine 100-10 MG/5ML syrup Take 5 mLs by mouth at bedtime as needed for cough. Do not drive or operate machinery as can cause drowsiness., Starting Wed 02/21/2016, Print        Note: This dictation was prepared with  Dragon dictation along with smaller phrase technology. Any transcriptional errors that result from this process are unintentional.    Clinical Course      [name], NP 02/21/16 1736

## 2016-05-17 ENCOUNTER — Other Ambulatory Visit: Payer: Self-pay | Admitting: Family Medicine

## 2016-05-17 DIAGNOSIS — I10 Essential (primary) hypertension: Secondary | ICD-10-CM

## 2016-05-17 DIAGNOSIS — F329 Major depressive disorder, single episode, unspecified: Secondary | ICD-10-CM

## 2016-05-17 DIAGNOSIS — F32A Depression, unspecified: Secondary | ICD-10-CM

## 2016-05-20 ENCOUNTER — Ambulatory Visit (INDEPENDENT_AMBULATORY_CARE_PROVIDER_SITE_OTHER): Payer: Commercial Managed Care - PPO | Admitting: Family Medicine

## 2016-05-20 ENCOUNTER — Encounter: Payer: Self-pay | Admitting: Family Medicine

## 2016-05-20 VITALS — BP 150/102 | HR 72 | Ht 67.0 in | Wt 186.0 lb

## 2016-05-20 DIAGNOSIS — Z9114 Patient's other noncompliance with medication regimen: Secondary | ICD-10-CM | POA: Diagnosis not present

## 2016-05-20 DIAGNOSIS — F32A Depression, unspecified: Secondary | ICD-10-CM | POA: Insufficient documentation

## 2016-05-20 DIAGNOSIS — I10 Essential (primary) hypertension: Secondary | ICD-10-CM

## 2016-05-20 DIAGNOSIS — M545 Low back pain, unspecified: Secondary | ICD-10-CM

## 2016-05-20 DIAGNOSIS — G8929 Other chronic pain: Secondary | ICD-10-CM

## 2016-05-20 DIAGNOSIS — F419 Anxiety disorder, unspecified: Secondary | ICD-10-CM

## 2016-05-20 DIAGNOSIS — F329 Major depressive disorder, single episode, unspecified: Secondary | ICD-10-CM

## 2016-05-20 DIAGNOSIS — F418 Other specified anxiety disorders: Secondary | ICD-10-CM

## 2016-05-20 MED ORDER — LOSARTAN POTASSIUM-HCTZ 50-12.5 MG PO TABS: 1.0000 | ORAL_TABLET | Freq: Every day | ORAL | 3 refills | Status: DC

## 2016-05-20 MED ORDER — VENLAFAXINE HCL 75 MG PO TABS: 75.0000 mg | ORAL_TABLET | Freq: Two times a day (BID) | ORAL | 1 refills | Status: DC

## 2016-05-20 MED ORDER — IBUPROFEN 600 MG PO TABS: 600.0000 mg | ORAL_TABLET | Freq: Three times a day (TID) | ORAL | 1 refills | Status: DC | PRN

## 2016-05-20 NOTE — Progress Notes (Signed)
Name: Kara Cisneros   MRN: [id number]    DOB: 1970-05-15   Date:05/20/2016       Progress Note  Subjective  Chief Complaint  Chief Complaint  Patient presents with  . Depression    has been "doubling up on med" to 150m    Depression       The patient presents with depression.  This is a recurrent problem.  The current episode started more than 1 month ago.   The onset quality is gradual.   The problem occurs intermittently.  The problem has been gradually worsening since onset.  Associated symptoms include body aches.  Associated symptoms include no decreased concentration, no fatigue, no helplessness, no hopelessness, does not have insomnia, not irritable, no restlessness, no decreased interest, no appetite change, no myalgias, no headaches, no indigestion, not sad and no suicidal ideas.     The symptoms are aggravated by medication.  Past treatments include SNRIs - Serotonin and norepinephrine reuptake inhibitors.  Compliance with treatment is variable.  Past compliance problems: patient increases medication dosing.  Risk factors include a change in medication usage/dosage.   Past medical history includes chronic pain and depression.     Pertinent negatives include no chronic fatigue syndrome. Hypertension  This is a chronic problem. The current episode started more than 1 year ago. The problem has been gradually worsening since onset. The problem is uncontrolled. Pertinent negatives include no blurred vision, chest pain, headaches, malaise/fatigue, neck pain, palpitations or shortness of breath. Past treatments include angiotensin blockers and diuretics. The current treatment provides mild improvement. There are no compliance problems.  There is no history of angina, kidney disease, CAD/MI, CVA, heart failure, left ventricular hypertrophy, PVD, renovascular disease or retinopathy. There is no history of chronic renal disease or a hypertension causing med.  Back Pain  This is a chronic  problem. The current episode started more than 1 year ago. The problem occurs intermittently. The pain is moderate. Pertinent negatives include no abdominal pain, chest pain, dysuria, fever, headaches, paresis, paresthesias, tingling or weight loss.    No problem-specific Assessment & Plan notes found for this encounter.   Past Medical History:  Diagnosis Date  . Anxiety   . Arthritis   . Depression   . Hypertension   . Plantar fascia syndrome     History reviewed. No pertinent surgical history.  Family History  Problem Relation Age of Onset  . Hypertension Maternal Grandmother   . Cancer Maternal Grandfather     Social History   Social History  . Marital status: Married    Spouse name: N/A  . Number of children: N/A  . Years of education: N/A   Occupational History  . Not on file.   Social History Main Topics  . Smoking status: Former Smoker    Quit date: 04/23/2007  . Smokeless tobacco: Never Used  . Alcohol use 0.0 oz/week  . Drug use: No  . Sexual activity: Yes   Other Topics Concern  . Not on file   Social History Narrative  . No narrative on file    No Known Allergies   Review of Systems  Constitutional: Negative for appetite change, chills, fatigue, fever, malaise/fatigue and weight loss.  HENT: Negative for ear discharge, ear pain and sore throat.   Eyes: Negative for blurred vision.  Respiratory: Negative for cough, sputum production, shortness of breath and wheezing.   Cardiovascular: Negative for chest pain, palpitations and leg swelling.  Gastrointestinal: Negative for abdominal pain,  blood in stool, constipation, diarrhea, heartburn, melena and nausea.  Genitourinary: Negative for dysuria, frequency, hematuria and urgency.  Musculoskeletal: Negative for back pain, joint pain, myalgias and neck pain.  Skin: Negative for rash.  Neurological: Negative for dizziness, tingling, sensory change, focal weakness, headaches and paresthesias.   Endo/Heme/Allergies: Negative for environmental allergies and polydipsia. Does not bruise/bleed easily.  Psychiatric/Behavioral: Positive for depression. Negative for decreased concentration and suicidal ideas. The patient is nervous/anxious. The patient does not have insomnia.      Objective  Vitals:   05/20/16 0813  BP: (!) 150/102  Pulse: 72  Weight: 186 lb (84.4 kg)  Height: '5\' 7"'  (1.702 m)    Physical Exam  Constitutional: She is well-developed, well-nourished, and in no distress. She is not irritable. No distress.  HENT:  Head: Normocephalic and atraumatic.  Right Ear: External ear normal.  Left Ear: External ear normal.  Nose: Nose normal.  Mouth/Throat: Oropharynx is clear and moist.  Eyes: Conjunctivae and EOM are normal. Pupils are equal, round, and reactive to light. Right eye exhibits no discharge. Left eye exhibits no discharge.  Neck: Normal range of motion. Neck supple. No JVD present. No thyromegaly present.  Cardiovascular: Normal rate, regular rhythm, normal heart sounds and intact distal pulses.  Exam reveals no gallop and no friction rub.   No murmur heard. Pulmonary/Chest: Effort normal and breath sounds normal.  Abdominal: Soft. Bowel sounds are normal. She exhibits no distension and no mass. There is no tenderness. There is no rebound and no guarding.  Musculoskeletal: Normal range of motion. She exhibits no edema.       Lumbar back: She exhibits spasm.  Lymphadenopathy:    She has no cervical adenopathy.  Neurological: She is alert. She has normal reflexes.  Skin: Skin is warm and dry. She is not diaphoretic.  Psychiatric: Mood and affect normal.  Nursing note and vitals reviewed.     Assessment & Plan  Problem List Items Addressed This Visit      Cardiovascular and Mediastinum   BP (high blood pressure) - Primary   Relevant Medications   losartan-hydrochlorothiazide (HYZAAR) 50-12.5 MG tablet   Other Relevant Orders   Renal Function Panel      Other   Anxiety and depression   Relevant Medications   venlafaxine (EFFEXOR) 75 MG tablet   Other Relevant Orders   Ambulatory referral to Psychiatry    Other Visit Diagnoses    Chronic bilateral low back pain without sciatica       Relevant Medications   ibuprofen (ADVIL,MOTRIN) 600 MG tablet   Other Relevant Orders   CBC   Sed Rate (ESR)   Noncompliance with medication regimen            Dr. Absalom [facility]  05/20/16

## 2016-05-21 LAB — SEDIMENTATION RATE: SED RATE: 2 mm/h (ref 0–32)

## 2016-05-21 LAB — CBC
HEMATOCRIT: 43 % (ref 34.0–46.6)
HEMOGLOBIN: 14.4 g/dL (ref 11.1–15.9)
MCH: 28.7 pg (ref 26.6–33.0)
MCHC: 33.5 g/dL (ref 31.5–35.7)
MCV: 86 fL (ref 79–97)
Platelets: 446 10*3/uL — ABNORMAL HIGH (ref 150–379)
RBC: 5.01 x10E6/uL (ref 3.77–5.28)
RDW: 13.8 % (ref 12.3–15.4)
WBC: 14.1 10*3/uL — ABNORMAL HIGH (ref 3.4–10.8)

## 2016-05-21 LAB — RENAL FUNCTION PANEL
ALBUMIN: 5 g/dL (ref 3.5–5.5)
BUN/Creatinine Ratio: 9 (ref 9–23)
BUN: 8 mg/dL (ref 6–24)
CHLORIDE: 101 mmol/L (ref 96–106)
CO2: 18 mmol/L (ref 18–29)
Calcium: 10.5 mg/dL — ABNORMAL HIGH (ref 8.7–10.2)
Creatinine, Ser: 0.85 mg/dL (ref 0.57–1.00)
GFR, EST AFRICAN AMERICAN: 97 mL/min/{1.73_m2} (ref >59)
GFR, EST NON AFRICAN AMERICAN: 84 mL/min/{1.73_m2} (ref >59)
GLUCOSE: 101 mg/dL — AB (ref 65–99)
PHOSPHORUS: 4.7 mg/dL — AB (ref 2.5–4.5)
POTASSIUM: 5.3 mmol/L — AB (ref 3.5–5.2)
Sodium: 142 mmol/L (ref 134–144)

## 2016-06-03 ENCOUNTER — Encounter (HOSPITAL_COMMUNITY): Payer: Self-pay

## 2016-06-04 ENCOUNTER — Ambulatory Visit
Admission: EM | Admit: 2016-06-04 | Discharge: 2016-06-04 | Disposition: A | Discharge status: Home / Self Care | Payer: Commercial Managed Care - PPO | Attending: Family Medicine | Admitting: Family Medicine

## 2016-06-04 DIAGNOSIS — J441 Chronic obstructive pulmonary disease with (acute) exacerbation: Secondary | ICD-10-CM

## 2016-06-04 DIAGNOSIS — J209 Acute bronchitis, unspecified: Secondary | ICD-10-CM

## 2016-06-04 MED ORDER — HYDROCOD POLST-CPM POLST ER 10-8 MG/5ML PO SUER: 5.0000 mL | Freq: Two times a day (BID) | ORAL | 0 refills | Status: DC

## 2016-06-04 MED ORDER — PREDNISONE 20 MG PO TABS: ORAL_TABLET | ORAL | 0 refills | Status: DC

## 2016-06-04 MED ORDER — IPRATROPIUM-ALBUTEROL 0.5-2.5 (3) MG/3ML IN SOLN
3.0000 mL | Freq: Once | RESPIRATORY_TRACT | Status: AC
  Administered 2016-06-04: 3 mL via RESPIRATORY_TRACT

## 2016-06-04 MED ORDER — ALBUTEROL SULFATE HFA 108 (90 BASE) MCG/ACT IN AERS: 1.0000 | INHALATION_SPRAY | Freq: Four times a day (QID) | RESPIRATORY_TRACT | 0 refills | Status: DC | PRN

## 2016-06-04 MED ORDER — DOXYCYCLINE HYCLATE 100 MG PO CAPS: 100.0000 mg | ORAL_CAPSULE | Freq: Two times a day (BID) | ORAL | 0 refills | Status: DC

## 2016-06-04 NOTE — ED Triage Notes (Signed)
Patient complains of cough, chest congestion, sore throat, sinus pain and pressure x 1 week.

## 2016-06-04 NOTE — ED Provider Notes (Signed)
CSN: [id number]     Arrival date & time 06/04/16  [id number] History   First MD Initiated Contact with Patient 06/04/16 1014     Chief Complaint  Patient presents with  . Cough   (Consider location/radiation/quality/duration/timing/severity/associated sxs/prior Treatment) HPI   46 year old female who presents with a one-week history of cough and chest congestion sore throat and sinus pain and pressure. She states that she is producing clear/ whitish phlegm. So states it is difficult to "break up" the congestion that she feels in her chest. She denies any fever or chills.Denies any nausea vomiting or diarrhea. He has a 20-pack-year history of smoking but has quit. Is exposed to secondhand smoke because her husband continues to smoke. She is afebrile today. Pulse rate of 88 blood pressure 152/96 respirations 18 O2 sats are 97% on room air. Does have a history of seasonal allergies and has used albuterol the past but she does not think was effective        Past Medical History:  Diagnosis Date  . Anxiety   . Arthritis   . Depression   . Hypertension   . Plantar fascia syndrome    History reviewed. No pertinent surgical history. Family History  Problem Relation Age of Onset  . Hypertension Maternal Grandmother   . Cancer Maternal Grandfather    Social History  Substance Use Topics  . Smoking status: Passive Smoke Exposure - Never Smoker    Last attempt to quit: 04/23/2007  . Smokeless tobacco: Never Used  . Alcohol use No   OB History    No data available     Review of Systems  Constitutional: Positive for activity change. Negative for chills, fatigue and fever.  HENT: Positive for congestion, postnasal drip, rhinorrhea, sinus pain and sinus pressure.   Respiratory: Positive for cough, shortness of breath and wheezing.   All other systems reviewed and are negative.   Allergies  Patient has no known allergies.  Home Medications   Prior to Admission medications   Medication  Sig Start Date End Date Taking? Authorizing Provider  etonogestrel (NEXPLANON) 68 MG IMPL implant Inject into the skin.   Yes Historical Provider, MD  ibuprofen (ADVIL,MOTRIN) 600 MG tablet Take 1 tablet (600 mg total) by mouth every 8 (eight) hours as needed. 05/20/16  Yes Juline Patch, MD  losartan-hydrochlorothiazide (HYZAAR) 50-12.5 MG tablet Take 1 tablet by mouth daily. 05/20/16  Yes Juline Patch, MD  Turmeric 500 MG CAPS Take by mouth.   Yes Historical Provider, MD  venlafaxine (EFFEXOR) 75 MG tablet Take 1 tablet (75 mg total) by mouth 2 (two) times daily. 05/20/16  Yes Juline Patch, MD  albuterol (PROVENTIL HFA;VENTOLIN HFA) 108 (90 Base) MCG/ACT inhaler Inhale 1-2 puffs into the lungs every 6 (six) hours as needed for wheezing or shortness of breath. Use with spacer 06/04/16   Lorin Picket, PA-C  chlorpheniramine-HYDROcodone [facility]) 10-8 MG/5ML SUER Take 5 mLs by mouth 2 (two) times daily. 06/04/16   Lorin Picket, PA-C  doxycycline (VIBRAMYCIN) 100 MG capsule Take 1 capsule (100 mg total) by mouth 2 (two) times daily. 06/04/16   Lorin Picket, PA-C  predniSONE (DELTASONE) 20 MG tablet Take 2 tablets (40 mg) daily by mouth 06/04/16   Lorin Picket, PA-C   Meds Ordered and Administered this Visit   Medications  ipratropium-albuterol (DUONEB) 0.5-2.5 (3) MG/3ML nebulizer solution 3 mL (3 mLs Nebulization Given 06/04/16 1052)    BP (!) 152/96 (BP Location:  Left Arm)   Pulse 88   Temp 98 F (36.7 C) (Oral)   Resp 18   Ht 5\' 7"  (1.702 m)   Wt 186 lb (84.4 kg)   LMP 05/28/2016   SpO2 100%   BMI 29.13 kg/m  No data found.   Physical Exam  Constitutional: She is oriented to person, place, and time. She appears well-developed and well-nourished. No distress.  HENT:  Head: Normocephalic and atraumatic.  Right Ear: External ear normal.  Left Ear: External ear normal.  Nose: Nose normal.  Mouth/Throat: Oropharynx is clear and moist.  Eyes: EOM are  normal. Pupils are equal, round, and reactive to light. Right eye exhibits no discharge. Left eye exhibits no discharge.  Neck: Normal range of motion. Neck supple.  Pulmonary/Chest: Effort normal. No respiratory distress. She has wheezes. She has no rales.  Musculoskeletal: Normal range of motion.  Lymphadenopathy:    She has no cervical adenopathy.  Neurological: She is alert and oriented to person, place, and time.  Skin: Skin is warm and dry. She is not diaphoretic.  Psychiatric: She has a normal mood and affect. Her behavior is normal. Judgment and thought content normal.  Nursing note and vitals reviewed.   Urgent Care Course     Procedures (including critical care time)  Labs Review Labs Reviewed - No data to display  Imaging Review No results found.   Visual Acuity Review  Right Eye Distance:   Left Eye Distance:   Bilateral Distance:    Right Eye Near:   Left Eye Near:    Bilateral Near:     Medications  ipratropium-albuterol (DUONEB) 0.5-2.5 (3) MG/3ML nebulizer solution 3 mL (3 mLs Nebulization Given 06/04/16 1052)      MDM   1. Acute bronchitis, unspecified organism   2. COPD exacerbation (HCC)   COPD like exacerbation not diagnostic Discharge Medication List as of 06/04/2016 11:12 AM    START taking these medications   Details  chlorpheniramine-HYDROcodone (TUSSIONEX PENNKINETIC ER) 10-8 MG/5ML SUER Take 5 mLs by mouth 2 (two) times daily., Starting Tue 06/04/2016, Print    doxycycline (VIBRAMYCIN) 100 MG capsule Take 1 capsule (100 mg total) by mouth 2 (two) times daily., Starting Tue 06/04/2016, Normal    predniSONE (DELTASONE) 20 MG tablet Take 2 tablets (40 mg) daily by mouth, Normal      Plan: 1. Test/x-ray results and diagnosis reviewed with patient 2. rx as per orders; risks, benefits, potential side effects reviewed with patient 3. Recommend supportive treatment with Use of albuterol as necessary for wheezing. Plenty of fluids. Warned  regarding use of Tussionex with activities requiring concentration and judgment. Do not drive while taking Tussionex. I'll up with her primary care physician if she is not improving. 4. F/u prn if symptoms worsen or don't improve     Lorin Picket, PA-C 06/04/16 1804

## 2016-06-12 ENCOUNTER — Encounter: Payer: Self-pay | Admitting: *Deleted

## 2016-06-12 ENCOUNTER — Institutional Professional Consult (permissible substitution): Payer: Commercial Managed Care - PPO | Admitting: Internal Medicine

## 2016-06-17 ENCOUNTER — Ambulatory Visit (INDEPENDENT_AMBULATORY_CARE_PROVIDER_SITE_OTHER): Payer: Commercial Managed Care - PPO | Admitting: Pulmonary Disease

## 2016-06-17 ENCOUNTER — Encounter: Payer: Self-pay | Admitting: Pulmonary Disease

## 2016-06-17 VITALS — BP 132/70 | HR 105 | Ht 64.0 in | Wt 187.0 lb

## 2016-06-17 DIAGNOSIS — Z87891 Personal history of nicotine dependence: Secondary | ICD-10-CM | POA: Diagnosis not present

## 2016-06-17 DIAGNOSIS — J209 Acute bronchitis, unspecified: Secondary | ICD-10-CM

## 2016-06-17 DIAGNOSIS — R0602 Shortness of breath: Secondary | ICD-10-CM

## 2016-06-17 DIAGNOSIS — R05 Cough: Secondary | ICD-10-CM | POA: Diagnosis not present

## 2016-06-17 DIAGNOSIS — F17200 Nicotine dependence, unspecified, uncomplicated: Secondary | ICD-10-CM | POA: Diagnosis not present

## 2016-06-17 DIAGNOSIS — K219 Gastro-esophageal reflux disease without esophagitis: Secondary | ICD-10-CM | POA: Diagnosis not present

## 2016-06-17 DIAGNOSIS — R052 Subacute cough: Secondary | ICD-10-CM

## 2016-06-17 DIAGNOSIS — R059 Cough, unspecified: Secondary | ICD-10-CM

## 2016-06-17 MED ORDER — BUDESONIDE-FORMOTEROL FUMARATE 160-4.5 MCG/ACT IN AERO: 2.0000 | INHALATION_SPRAY | Freq: Two times a day (BID) | RESPIRATORY_TRACT | 12 refills | Status: DC

## 2016-06-17 MED ORDER — PREDNISONE 20 MG PO TABS: 40.0000 mg | ORAL_TABLET | Freq: Every day | ORAL | 0 refills | Status: AC

## 2016-06-17 MED ORDER — BUDESONIDE-FORMOTEROL FUMARATE 160-4.5 MCG/ACT IN AERO: 2.0000 | INHALATION_SPRAY | Freq: Two times a day (BID) | RESPIRATORY_TRACT | 0 refills | Status: DC

## 2016-06-17 MED ORDER — ALBUTEROL SULFATE HFA 108 (90 BASE) MCG/ACT IN AERS: 1.0000 | INHALATION_SPRAY | Freq: Four times a day (QID) | RESPIRATORY_TRACT | 12 refills | Status: DC | PRN

## 2016-06-17 NOTE — Patient Instructions (Signed)
1) Continue Symibcort 16-4.5. Prescription sent to pharmacy 2) Continue albuterol inhaler as needed 3) Resume omeprazole 40 mg each day after dinner or one hour before bedtime 4) Fill prescription for Tessalon Perles and take as prescribed 5) Delsym - over the counter cough suppressant. Use liberally as directed 7) NO SMOKING AT ALL!!! And avoid heavy exposure to second hand smoke 8) Follow up in 3-4 weeks

## 2016-06-17 NOTE — Progress Notes (Signed)
PULMONARY CONSULT NOTE  Requesting MD/Service:  Date of initial consultation: 06/17/2016 Reason for consultation: cough, dyspnea  Kara PROFILE: 46 y.o. Cisneros "former" smoker of 20 pack years referred for evaluation of cough and dyspnea of approx 3 weeks duration.   HPI:  As above. She is a "former smoker" but occasionally takes a puff from her husband's cigarettes. On the day of onset of these symptoms, she notably smoked "a half of a cigarette". The following morning, she awoke with chest congestion and cough. These symptoms persisted for approx a week and she went to an urgent care center where she was treated with doxycycline and prednisone. She had transient improvement but symptoms persisted and relapsed and she was seen in the ED @ [facility] where she was treated with nebulized bronchodilators with some improvement. She was prescribed an albuterol inhaler which she is using 4-6 times per day with transient improvement. She was also given a sample of Symbicort and a prescription for [employer] which she has not filled. She did fill a prescription for a codeine cough suppressant which she took once. It caused nausea and she didn't like the way it made her feel so she has not used it since. She presently has persistent vigorous cough and occasional paroxysms. Her cough is mostly NP - occasionaly, she will bring up some scant mucus. She denies CP, fever, purulent sputum, hemoptysis, LE edema and calf tenderness.  Past Medical History:  Diagnosis Date  . Anxiety   . Arthritis   . Depression   . Hypertension   . Plantar fascia syndrome     No past surgical history on file.  MEDICATIONS: I have reviewed all medications and confirmed regimen as documented  Social History   Social History  . Marital status: Married    Spouse name: N/A  . Number of children: N/A  . Years of education: N/A   Occupational History  . Not on file.   Social History Main Topics  . Smoking status: Current Every  Day Smoker  . Smokeless tobacco: Never Used     Comment: puff or two everyday  . Alcohol use No  . Drug use: No  . Sexual activity: Yes   Other Topics Concern  . Not on file   Social History Narrative  . No narrative on file    Family History  Problem Relation Age of Onset  . Hypertension Maternal Grandmother   . Cancer Maternal Grandfather     ROS: No fever, myalgias/arthralgias, unexplained weight loss or weight gain No new focal weakness or sensory deficits No otalgia, hearing loss, visual changes, nasal and sinus symptoms, mouth and throat problems No neck pain or adenopathy No abdominal pain, N/V/D, diarrhea, change in bowel pattern No dysuria, change in urinary pattern   Vitals:   06/17/16 0917  BP: 132/70  Pulse: (!) 105  SpO2: 96%  Weight: 187 lb (84.8 kg)  Height: 5\' 4"  (1.626 m)     EXAM:  Gen: WDWN, No overt respiratory distress but coughing intermittently and vigorously  HEENT: NCAT, sclera white, oropharynx normal, mild rhinitis Neck: Supple without LAN, thyromegaly, JVD Lungs: very coarse rhonchi and wheezes throughout Cardiovascular: RRR, no murmurs noted Abdomen: Soft, nontender, normal BS Ext: without clubbing, cyanosis, edema Neuro: CNs grossly intact, motor and sensory intact Skin: Limited exam, no lesions noted  DATA:   BMP Latest Ref Rng & Units 05/20/2016 01/15/2016  Glucose 65 - 99 mg/dL 101(H) 113(H)  BUN 6 - 24 mg/dL 8 9  Creatinine 0.57 - 1.00 mg/dL 0.85 0.76  BUN/Creat Ratio 9 - 23 9 12   Sodium 134 - 144 mmol/L 142 141  Potassium 3.5 - 5.2 mmol/L 5.3(H) 4.2  Chloride 96 - 106 mmol/L 101 97  CO2 18 - 29 mmol/L 18 25  Calcium 8.7 - 10.2 mg/dL 10.5(H) 10.3(H)    CBC Latest Ref Rng & Units 05/20/2016  WBC 3.4 - 10.8 x10E3/uL 14.1(H)  Hematocrit 34.0 - 46.6 % 43.0  Platelets 150 - 379 x10E3/uL 446(H)    CXR (06/13/16):  Report from [facility] - no acute findings  IMPRESSION:   Severe subacute cough and mild DOE due to acute  bronchitis.  Ongoing (albeit minimal) smoking.  Suspect a component of LPR Suspect component of cyclical cough  PLAN:  1) Continue Symibcort 16-4.5. Prescription sent to pharmacy 2) Continue albuterol inhaler as needed 3) Resume omeprazole 40 mg each day after dinner or one hour before bedtime 4) Fill prescription for Tessalon Perles and take as prescribed - TID X one week 5) Delsym - over the counter cough suppressant. Use liberally as directed 7) NO SMOKING AT ALL!!! And avoid heavy exposure to second hand smoke 8) Follow up in 3-4 weeks   Merton Border, MD PCCM service Mobile [phone] Pager [phone] 06/17/2016

## 2016-06-18 ENCOUNTER — Ambulatory Visit (HOSPITAL_COMMUNITY): Payer: Managed Care, Other (non HMO) | Admitting: Psychiatry

## 2016-06-19 ENCOUNTER — Ambulatory Visit (HOSPITAL_COMMUNITY): Payer: Managed Care, Other (non HMO) | Admitting: Psychology

## 2016-06-24 ENCOUNTER — Telehealth: Payer: Self-pay | Admitting: Pulmonary Disease

## 2016-06-24 MED ORDER — AMOXICILLIN-POT CLAVULANATE 875-125 MG PO TABS: 1.0000 | ORAL_TABLET | Freq: Two times a day (BID) | ORAL | 0 refills | Status: DC

## 2016-06-24 MED ORDER — PREDNISONE 20 MG PO TABS: 40.0000 mg | ORAL_TABLET | Freq: Every day | ORAL | 0 refills | Status: DC

## 2016-06-24 NOTE — Telephone Encounter (Signed)
Per DK, give pt augmentin 875 BID x 10 days and Prednisone 40mg  x 5days. If no better, then will need to schedule f/u appt.   Pt informed. Nothing further needed.

## 2016-06-24 NOTE — Telephone Encounter (Signed)
Pt states she is not any better. She states when she has medication in her, she is fine, but when the medication wears off, her cough is horrible, and she has to use her inhalers . Please call.

## 2016-06-24 NOTE — Telephone Encounter (Signed)
LMOM for pt to return call. 

## 2016-06-24 NOTE — Telephone Encounter (Signed)
Pt states she is still coughing and wheezing. Finished the Prednisone that was for 5 days. Feels like medication wears off. Is doing Symbicort 160 and is using her albuterol. Please advise

## 2016-07-01 ENCOUNTER — Ambulatory Visit: Payer: Self-pay | Admitting: Family Medicine

## 2016-07-10 ENCOUNTER — Ambulatory Visit: Payer: Self-pay | Admitting: Psychiatry

## 2016-07-15 ENCOUNTER — Institutional Professional Consult (permissible substitution): Payer: Commercial Managed Care - PPO | Admitting: Internal Medicine

## 2016-07-18 DIAGNOSIS — K219 Gastro-esophageal reflux disease without esophagitis: Secondary | ICD-10-CM | POA: Insufficient documentation

## 2016-07-18 DIAGNOSIS — E041 Nontoxic single thyroid nodule: Secondary | ICD-10-CM | POA: Insufficient documentation

## 2016-07-29 ENCOUNTER — Ambulatory Visit: Payer: Commercial Managed Care - PPO | Admitting: Pulmonary Disease

## 2016-08-26 ENCOUNTER — Encounter (HOSPITAL_BASED_OUTPATIENT_CLINIC_OR_DEPARTMENT_OTHER): Payer: Self-pay

## 2016-08-26 ENCOUNTER — Ambulatory Visit (HOSPITAL_BASED_OUTPATIENT_CLINIC_OR_DEPARTMENT_OTHER): Admit: 2016-08-26 | Payer: Commercial Managed Care - PPO | Admitting: Obstetrics & Gynecology

## 2016-08-26 SURGERY — HYSTERECTOMY, VAGINAL, LAPAROSCOPY-ASSISTED, WITH SALPINGECTOMY
Anesthesia: General | Laterality: Bilateral

## 2016-11-20 ENCOUNTER — Other Ambulatory Visit: Payer: Self-pay

## 2017-02-24 ENCOUNTER — Ambulatory Visit
Admission: EM | Admit: 2017-02-24 | Discharge: 2017-02-24 | Disposition: A | Discharge status: Home / Self Care | Payer: Medicaid Other | Attending: Family Medicine | Admitting: Family Medicine

## 2017-02-24 DIAGNOSIS — R519 Headache, unspecified: Secondary | ICD-10-CM

## 2017-02-24 DIAGNOSIS — J0100 Acute maxillary sinusitis, unspecified: Secondary | ICD-10-CM

## 2017-02-24 DIAGNOSIS — I10 Essential (primary) hypertension: Secondary | ICD-10-CM | POA: Diagnosis not present

## 2017-02-24 DIAGNOSIS — R51 Headache: Secondary | ICD-10-CM | POA: Diagnosis not present

## 2017-02-24 LAB — BASIC METABOLIC PANEL
Anion gap: 9 (ref 5–15)
BUN: 10 mg/dL (ref 6–20)
CO2: 23 mmol/L (ref 22–32)
Calcium: 9.2 mg/dL (ref 8.9–10.3)
Chloride: 105 mmol/L (ref 101–111)
Creatinine, Ser: 0.75 mg/dL (ref 0.44–1.00)
GFR calc Af Amer: >60 mL/min (ref >60)
Glucose, Bld: 135 mg/dL — ABNORMAL HIGH (ref 65–99)
POTASSIUM: 3.6 mmol/L (ref 3.5–5.1)
SODIUM: 137 mmol/L (ref 135–145)

## 2017-02-24 MED ORDER — HYDROCHLOROTHIAZIDE 25 MG PO TABS: 12.5000 mg | ORAL_TABLET | Freq: Every day | ORAL | 0 refills | Status: DC

## 2017-02-24 MED ORDER — AMOXICILLIN-POT CLAVULANATE 875-125 MG PO TABS: 1.0000 | ORAL_TABLET | Freq: Two times a day (BID) | ORAL | 0 refills | Status: DC

## 2017-02-24 NOTE — ED Triage Notes (Signed)
Patient complains of headache x 2 weeks with light sensitivity, unable to sleep. Patient states that this improves some with taking a decongestant. Patient reports that yesterday a rash appeared yesterday on the right side of her face. Patient states that she has also been having a cough at times.

## 2017-02-24 NOTE — Discharge Instructions (Signed)
Take medication as prescribed. Rest. Drink plenty of fluids. Stop current decongestant. Monitor blood pressure at home.   Follow up with your primary care physician as soon as possible.  Return to Urgent care for new or worsening concerns.

## 2017-02-24 NOTE — ED Provider Notes (Signed)
[name] ____________________________________________  Time seen: Approximately 8:43 AM  I have reviewed the triage vital signs and the nursing notes.   HISTORY  Chief Complaint Headache   HPI Kara Cisneros is a 46 y.o. female presenting for evaluation of headache as well as nasal congestion that has been present for the last 2 weeks.  Patient reports initially starting out with runny nose and nasal congestion that is since continued, and noticing sinus pressure and frontal headache.  Patient states headache does intermittently resolve with over-the-counter decongestants, however the headache returns.  Patient reports last took a decongestant at approximately 5 AM this morning.  Denies accompanying fevers.  States occasional cough.  Reports continues to eat and drink well.  States headache has caused some mild light sensitivity, no vision changes.  Denies unilateral weakness, paresthesias, dizziness, syncope, near syncope or other complaints.  States has been taking the over-the-counter multi agent decongestants but it helped well.  Patient states she does monitor her blood pressure regularly at home and has normally been getting 130s-140s over 90s, however reports over the last week blood pressure has been noted to be somewhat more elevated.  Patient states that she has been off of her blood pressure medications for approximately 6 months, previously taken Hyzaar 50/12.5.  Patient states that she had lost some weight and have less stress and her blood pressure was coming down and was not having to take that high of an agent, but again reports she has been out of her medications for 6 months.  States awaiting her Medicaid card directing care to her primary care physician.  Denies chest pain, shortness of breath, abdominal pain.  Also states that she noticed a little bit of a rash to her right face and neck yesterday after being in a hot area, states that she does  sometimes get rashes similar to this when exposed to very hot areas, and states that she has sensitive skin.  Denies any changes or known triggers.  Denies recent sickness. Denies recent antibiotic use.  No LMP recorded. Patient has had an implant. Denies pregnancy.    Past Medical History:  Diagnosis Date  . Anxiety   . Arthritis   . Depression   . Hypertension   . Plantar fascia syndrome     Patient Active Problem List   Diagnosis Date Noted  . Anxiety and depression 05/20/2016  . Abnormal finding on thyroid function test 07/20/2012  . Acid reflux 07/20/2012  . BP (high blood pressure) 07/20/2012  . Multinodular goiter 07/20/2012    Past Surgical History:  Procedure Laterality Date  . DILATION AND CURETTAGE OF UTERUS      No current facility-administered medications for this encounter.   Current Outpatient Medications:  .  etonogestrel (NEXPLANON) 68 MG IMPL implant, Inject into the skin., Disp: , Rfl:  .  ibuprofen (ADVIL,MOTRIN) 600 MG tablet, Take 1 tablet (600 mg total) by mouth every 8 (eight) hours as needed., Disp: 90 tablet, Rfl: 1 .  amoxicillin-clavulanate (AUGMENTIN) 875-125 MG tablet, Take 1 tablet every 12 (twelve) hours by mouth., Disp: 20 tablet, Rfl: 0 .  hydrochlorothiazide (HYDRODIURIL) 25 MG tablet, Take 0.5 tablets (12.5 mg total) daily by mouth., Disp: 25 tablet, Rfl: 0  Allergies Patient has no known allergies.  Family History  Problem Relation Age of Onset  . Hypertension Maternal Grandmother   . Cancer Maternal Grandfather     Social History Social History   Tobacco Use  . Smoking status: Current Every  Day Smoker  . Smokeless tobacco: Never Used  . Tobacco comment: puff or two everyday  Substance Use Topics  . Alcohol use: Yes    Alcohol/week: 0.0 oz    Comment: socially  . Drug use: No    Review of Systems Constitutional: No fever/chills Eyes: No visual changes, some light sensitivity as above.  ENT: No sore  throat. Cardiovascular: Denies chest pain. Respiratory: Denies shortness of breath. Gastrointestinal: No abdominal pain.  No nausea, no vomiting.  Genitourinary: Negative for dysuria. Musculoskeletal: Negative for back pain. Skin: Negative for rash. Neurological: Negative for focal weakness or numbness.    ____________________________________________   PHYSICAL EXAM:  VITAL SIGNS: ED Triage Vitals  Enc Vitals Group     BP 02/24/17 0821 (!) 172/106     Pulse Rate 02/24/17 0821 85     Resp 02/24/17 0821 17     Temp 02/24/17 0821 98.5 F (36.9 C)     Temp Source 02/24/17 0821 Oral     SpO2 02/24/17 0821 99 %     Weight 02/24/17 0817 193 lb (87.5 kg)     Height 02/24/17 0817 5\' 7"  (1.702 m)     Head Circumference --      Peak Flow --      Pain Score 02/24/17 0817 7     Pain Loc --      Pain Edu? --      Excl. in [place]? --     Constitutional: Alert and oriented. Well appearing and in no acute distress. Eyes: Conjunctivae are normal. PERRL.   Head: Atraumatic.Mild tenderness to palpation bilateral maxillary sinuses, minimal bilateral frontal. No swelling. No erythema.   Ears: no erythema, normal TMs bilaterally.   Nose: nasal congestion with bilateral nasal turbinate erythema and edema.   Mouth/Throat: Mucous membranes are moist.  Oropharynx non-erythematous.No tonsillar swelling or exudate.  Neck: No stridor.  No cervical spine tenderness to palpation. Hematological/Lymphatic/Immunilogical: No cervical lymphadenopathy. Cardiovascular: Normal rate, regular rhythm. Grossly normal heart sounds.  Good peripheral circulation. Respiratory: Normal respiratory effort.  No retractions.No wheezes, rales or rhonchi. Good air movement.  Gastrointestinal:No CVA tenderness. Musculoskeletal: No cervical, thoracic or lumbar tenderness to palpation. Bilateral hand grips equal. Neurologic:  Normal speech and language. No gross focal neurologic deficits are appreciated. No gait instability. Np  paresthesias. Negative Romberg.  Skin:  Skin is warm, dry and intact. No rash noted. Psychiatric: Mood and affect are normal. Speech and behavior are normal.  ___________________________________________   LABS (all labs ordered are listed, but only abnormal results are displayed)  Labs Reviewed  BASIC METABOLIC PANEL - Abnormal; Notable for the following components:      Result Value   Glucose, Bld 135 (*)    All other components within normal limits    PROCEDURES Procedures   INITIAL IMPRESSION / ASSESSMENT AND PLAN / ED COURSE  Pertinent labs & imaging results that were available during my care of the patient were reviewed by me and considered in my medical decision making (see chart for details).  Well-appearing patient.  No acute distress.  No focal neurological deficits.  Patient with history of hypertension for 10+ years and on meds, but off for the last 6 months sized to a loss of insurance.  Also complaining of 2 weeks of nasal congestion and taking over-the-counter decongestants.  Suspect combination of sinusitis as well as patient taking decongestants and off of her blood pressure medicine contributing to current symptoms and current blood pressure.  Patient monitoring blood  pressure at home and getting 130s-140s over 90s.  BMP reviewed.  Will treat patient with oral Augmentin and restart patient on HCTZ 12.5 initially.  Counseled to stop decongestants over-the-counter, using only over-the-counter Coricidin and closely monitoring blood pressure.  Encouraged also dietary contribution to hypertension.  Encourage rest, fluids and supportive care.  Encourage patient to promptly establish primary care as soon as possible.Discussed indication, risks and benefits of medications with patient.   Discussed follow up and return parameters including no resolution or any worsening concerns. Patient verbalized understanding and agreed to plan.    ____________________________________________   FINAL CLINICAL IMPRESSION(S) / ED DIAGNOSES  Final diagnoses:  Nonintractable headache, unspecified chronicity pattern, unspecified headache type  Acute maxillary sinusitis, recurrence not specified  Hypertension, unspecified type     This SmartLink is deprecated. Use AVSMEDLIST instead to display the medication list for a patient.  Note: This dictation was prepared with Dragon dictation along with smaller phrase technology. Any transcriptional errors that result from this process are unintentional.         [name], NP 02/24/17 [phone]

## 2017-07-16 ENCOUNTER — Encounter: Payer: Self-pay | Admitting: Podiatry

## 2017-07-16 ENCOUNTER — Ambulatory Visit (INDEPENDENT_AMBULATORY_CARE_PROVIDER_SITE_OTHER): Payer: Commercial Managed Care - PPO

## 2017-07-16 ENCOUNTER — Ambulatory Visit: Payer: Medicaid Other | Admitting: Podiatry

## 2017-07-16 DIAGNOSIS — D492 Neoplasm of unspecified behavior of bone, soft tissue, and skin: Secondary | ICD-10-CM | POA: Diagnosis not present

## 2017-07-16 DIAGNOSIS — M722 Plantar fascial fibromatosis: Secondary | ICD-10-CM

## 2017-07-16 DIAGNOSIS — D3613 Benign neoplasm of peripheral nerves and autonomic nervous system of lower limb, including hip: Secondary | ICD-10-CM

## 2017-07-16 NOTE — Progress Notes (Signed)
Subjective:  Patient ID: Kara Cisneros, female    DOB: 09-07-1970,  MRN: [id number] HPI Chief Complaint  Patient presents with  . Foot Pain    Patient presents today for bilat feet pain x 3-4 years.  He has hx of stress fractures in both feet about 1 year ago.  She reports the pain can be throbbing, pulsing pain, cramping when feet get cold.  Pain becomes worse when walking or standing for short periods of time.  She has used gel inserts, compression anklets, ice and heat packs, and tylenol with no relief    47 y.o. female presents with the above complaint.   ROS: Denies fever chills nausea vomiting muscle aches pains calf pain chest pain shortness of breath headache.  Past Medical History:  Diagnosis Date  . Anxiety   . Arthritis   . Depression   . Hypertension   . Plantar fascia syndrome    Past Surgical History:  Procedure Laterality Date  . DILATION AND CURETTAGE OF UTERUS      Current Outpatient Medications:  .  albuterol (PROVENTIL HFA;VENTOLIN HFA) 108 (90 Base) MCG/ACT inhaler, Inhale into the lungs., Disp: , Rfl:  .  ibuprofen (ADVIL,MOTRIN) 600 MG tablet, , Disp: , Rfl:  .  citalopram (CELEXA) 20 MG tablet, Take by mouth., Disp: , Rfl:  .  hydrochlorothiazide (HYDRODIURIL) 25 MG tablet, Take 0.5 tablets (12.5 mg total) daily by mouth., Disp: 25 tablet, Rfl: 0 .  omeprazole (PRILOSEC) 20 MG capsule, , Disp: , Rfl:  .  propranolol (INDERAL) 20 MG tablet, , Disp: , Rfl:   No Known Allergies Review of Systems Objective:  There were no vitals filed for this visit.  General: Well developed, nourished, in no acute distress, alert and oriented x3   Dermatological: Skin is warm, dry and supple bilateral. Nails x 10 are well maintained; remaining integument appears unremarkable at this time. There are no open sores, no preulcerative lesions, no rash or signs of infection present.  No open lesions or wounds or discolorations of the feet  Vascular: Dorsalis Pedis  artery and Posterior Tibial artery pedal pulses are 2/4 bilateral with immedate capillary fill time. Pedal hair growth present. No varicosities and no lower extremity edema present bilateral.   Neruologic: Grossly intact via light touch bilateral. Vibratory intact via tuning fork bilateral. Protective threshold with Semmes Wienstein monofilament intact to all pedal sites bilateral. Patellar and Achilles deep tendon reflexes 2+ bilateral. No Babinski or clonus noted bilateral.  Hyper sensate along the tibial nerve and to the neuroma with a palpable Mulder's click bilaterally.  Musculoskeletal: No gross boney pedal deformities bilateral. No pain, crepitus, or limitation noted with foot and ankle range of motion bilateral. Muscular strength 5/5 in all groups tested bilateral.  She has pain on palpation of all the metatarsals and intermetatarsal spaces.  She does not have pain on palpation medial calcaneal tubercle.  Gait: Unassisted, Nonantalgic.    Radiographs:  Demonstrates no acute findings.  No chronic findings.  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  Cannot rule out tarsal tunnel syndrome.  Plan: Discussed etiology pathology conservative or surgical therapies.  She will be following up with rheumatology in the near future for evaluation and workup.  I expressed to her that more than likely they will be sending her to neurology to evaluate her spinal problems as well as her radiating pains from her thighs to her feet.  I also injected 20 mg of Kenalog 5 mg Marcaine to the point of maximal tenderness third interdigital space bilateral after sterile Betadine skin prep.  We will follow-up with her in about 6 weeks provided she is not being seen by neurology.     Max T. [place], [place]

## 2017-08-27 ENCOUNTER — Ambulatory Visit: Payer: Medicaid Other | Admitting: Podiatry

## 2017-09-01 ENCOUNTER — Emergency Department
Admission: EM | Admit: 2017-09-01 | Discharge: 2017-09-01 | Disposition: A | Discharge status: Home / Self Care | Payer: Medicaid Other | Attending: Emergency Medicine | Admitting: Emergency Medicine

## 2017-09-01 ENCOUNTER — Encounter: Payer: Self-pay | Admitting: Emergency Medicine

## 2017-09-01 ENCOUNTER — Other Ambulatory Visit: Payer: Self-pay

## 2017-09-01 DIAGNOSIS — F17200 Nicotine dependence, unspecified, uncomplicated: Secondary | ICD-10-CM | POA: Diagnosis not present

## 2017-09-01 DIAGNOSIS — F419 Anxiety disorder, unspecified: Secondary | ICD-10-CM

## 2017-09-01 DIAGNOSIS — Z79899 Other long term (current) drug therapy: Secondary | ICD-10-CM | POA: Insufficient documentation

## 2017-09-01 DIAGNOSIS — I10 Essential (primary) hypertension: Secondary | ICD-10-CM | POA: Diagnosis present

## 2017-09-01 DIAGNOSIS — R519 Headache, unspecified: Secondary | ICD-10-CM

## 2017-09-01 DIAGNOSIS — R51 Headache: Secondary | ICD-10-CM

## 2017-09-01 MED ORDER — DIAZEPAM 2 MG PO TABS
2.0000 mg | ORAL_TABLET | Freq: Once | ORAL | Status: AC
  Administered 2017-09-01: 2 mg via ORAL

## 2017-09-01 MED ORDER — ACETAMINOPHEN 500 MG PO TABS
1000.0000 mg | ORAL_TABLET | Freq: Once | ORAL | Status: AC
  Administered 2017-09-01: 1000 mg via ORAL
  Filled 2017-09-01: qty 2

## 2017-09-01 MED ORDER — LISINOPRIL 10 MG PO TABS
10.0000 mg | ORAL_TABLET | Freq: Once | ORAL | Status: AC
  Administered 2017-09-01: 10 mg via ORAL
  Filled 2017-09-01: qty 1

## 2017-09-01 MED ORDER — DIAZEPAM 2 MG PO TABS
ORAL_TABLET | ORAL | Status: AC
  Filled 2017-09-01: qty 1

## 2017-09-01 MED ORDER — LISINOPRIL 10 MG PO TABS: 10.0000 mg | ORAL_TABLET | Freq: Every day | ORAL | 0 refills | Status: AC

## 2017-09-01 NOTE — ED Provider Notes (Addendum)
[facility] Emergency Department Provider Note  ___________________________________________   First MD Initiated Contact with Patient 09/01/17 1949     (approximate)  I have reviewed the triage vital signs and the nursing notes.   HISTORY  Chief Complaint Hypertension  HPI Kara Cisneros is a 47 y.o. female history of anxiety as well as hypertension was presenting to the emergency department today with elevated blood pressure as well as a frontal headache.  She has been off of her lisinopril over the past several months and over the past month feels like she is having increasing blood pressure.  She says that she stopped her lisinopril because of the side effect of cough.  However, she was says that she was also diagnosed and started treatment for GERD at the same time and is concerned that the cough may been secondary to GERD.  She says that she has been very stressed today and intermittently tearful.  Does not report any suicidal or homicidal ideation.  Says that the stress may be causing her high blood pressure.  However, says that she is also developed a frontal headache, gradually, which is an 8-9 out of 10 and feels like a tightness.  She says that her headache is associated with dizziness which she has had multiple times before with hypertension headaches.  She says that she is otherwise compliant with her medications which are HCTZ and propranolol.  Says that she took an extra dose of propranolol today after taking her blood pressure at home and it being elevated.  Says that she also feels nauseous when she gets the dizzy sensation.  Past Medical History:  Diagnosis Date  . Anxiety   . Arthritis   . Depression   . Hypertension   . Plantar fascia syndrome     Patient Active Problem List   Diagnosis Date Noted  . Laryngopharyngeal reflux (LPR) 07/18/2016  . Thyroid nodule 07/18/2016  . Anxiety and depression 05/20/2016  . Abnormal finding on  thyroid function test 07/20/2012  . Acid reflux 07/20/2012  . BP (high blood pressure) 07/20/2012  . Multinodular goiter 07/20/2012    Past Surgical History:  Procedure Laterality Date  . DILATION AND CURETTAGE OF UTERUS      Prior to Admission medications   Medication Sig Start Date End Date Taking? Authorizing Provider  albuterol (PROVENTIL HFA;VENTOLIN HFA) 108 (90 Base) MCG/ACT inhaler Inhale into the lungs. 06/04/16   [provider]  citalopram (CELEXA) 20 MG tablet Take by mouth.    [provider]  hydrochlorothiazide (HYDRODIURIL) 25 MG tablet Take 0.5 tablets (12.5 mg total) daily by mouth. 02/24/17   [name], NP  ibuprofen (ADVIL,MOTRIN) 600 MG tablet  06/24/16   [provider]  omeprazole (PRILOSEC) 20 MG capsule  04/16/17   [provider]  propranolol (INDERAL) 20 MG tablet  04/29/17   [provider]    Allergies Patient has no known allergies.  Family History  Problem Relation Age of Onset  . Hypertension Maternal Grandmother   . Cancer Maternal Grandfather     Social History Social History   Tobacco Use  . Smoking status: Current Every Day Smoker  . Smokeless tobacco: Never Used  . Tobacco comment: puff or two everyday  Substance Use Topics  . Alcohol use: Yes    Alcohol/week: 0.0 oz    Comment: socially  . Drug use: No    Review of Systems  Constitutional: No fever/chills Eyes: No visual changes. ENT: No  sore throat. Cardiovascular: Denies chest pain. Respiratory: Denies shortness of breath. Gastrointestinal: No abdominal pain.  no vomiting.  No diarrhea.  No constipation. Genitourinary: Negative for dysuria. Musculoskeletal: Negative for back pain. Skin: Negative for rash. Neurological: Negative for focal weakness or numbness.   ____________________________________________   PHYSICAL EXAM:  VITAL SIGNS: ED Triage Vitals  Enc Vitals Group     BP 09/01/17 1808 (!) 175/111     Pulse Rate  09/01/17 1808 88     Resp 09/01/17 1808 16     Temp 09/01/17 1808 98.7 F (37.1 C)     Temp Source 09/01/17 1808 Oral     SpO2 09/01/17 1808 99 %     Weight 09/01/17 1809 185 lb (83.9 kg)     Height 09/01/17 1809 5\' 7"  (1.702 m)     Head Circumference --      Peak Flow --      Pain Score 09/01/17 1808 5     Pain Loc --      Pain Edu? --      Excl. in [place]? --     Constitutional: Alert and oriented. Well appearing and in no acute distress. Eyes: Conjunctivae are normal.  Head: Atraumatic. Nose: No congestion/rhinnorhea. Mouth/Throat: Mucous membranes are moist.  Neck: No stridor.   Cardiovascular: Normal rate, regular rhythm. Grossly normal heart sounds.   Respiratory: Normal respiratory effort.  No retractions. Lungs CTAB. Gastrointestinal: Soft and nontender. No distention. No CVA tenderness. Musculoskeletal: No lower extremity tenderness nor edema.  No joint effusions. Neurologic:  Normal speech and language. No gross focal neurologic deficits are appreciated.  No nystagmus.  No ataxia on finger-to-nose testing. Skin:  Skin is warm, dry and intact. No rash noted. Psychiatric: Mood and affect are normal. Speech and behavior are normal.  ____________________________________________   LABS (all labs ordered are listed, but only abnormal results are displayed)  Labs Reviewed - No data to display ____________________________________________  EKG   ____________________________________________  RADIOLOGY   ____________________________________________   PROCEDURES  Procedure(s) performed:  Procedures  Critical Care performed:   ____________________________________________   INITIAL IMPRESSION / ASSESSMENT AND PLAN / ED COURSE  Pertinent labs & imaging results that were available during my care of the patient were reviewed by me and considered in my medical decision making (see chart for details).  Differential diagnosis includes, but is not limited to,  intracranial hemorrhage, meningitis/encephalitis, previous head trauma, cavernous venous thrombosis, tension headache, temporal arteritis, migraine or migraine equivalent, idiopathic intracranial hypertension, and non-specific headache. As part of my medical decision making, I reviewed the following data within the [facility] previous outpatient visits.  ----------------------------------------- 10:51 PM on 09/01/2017 -----------------------------------------  Patient at this time has an improved blood pressure to 159/109.  She says that her symptoms have improved including the headache and the dizziness.  Says that she is only minimal dizziness at this time but says that she is going to eat and that she anticipates that the symptoms will continue to improve as her blood pressure comes down.  He says that she has had similar symptoms in the past with elevated blood pressure.  Continues to be without any focal neurologic deficit.  Patient will be discharged with a prescription for lisinopril.  Patient to follow-up with her primary care doctor as well as her therapist.  Patient understanding of the diagnosis as well as treatment plan and willing to comply.  I do not feel the patient is having secondary cephalgia.  Reassuring neurologic  exam.  Headache decreasing with decreased blood pressure and previous symptoms which have been similar.  Also, the patient has expressed that she has been anxious today but does not express any suicidal homicidal ideation.  We discussed follow-up with her therapist for ongoing recommendations and possible medication. ____________________________________________   FINAL CLINICAL IMPRESSION(S) / ED DIAGNOSES  Hypertension.  Headache.  Anxiety.    NEW MEDICATIONS STARTED DURING THIS VISIT:  New Prescriptions   No medications on file     Note:  This document was prepared using Dragon voice recognition software and may include unintentional  dictation errors.     Orbie Pyo, MD 09/01/17 2252    Orbie Pyo, MD 09/01/17 [phone]

## 2017-09-01 NOTE — ED Triage Notes (Addendum)
Pt to ED via POV with c/o BP of 191/119 at home after checking due to headache. Hx of HTN. Pt takes HTZ 20mg  daily and propanolol bid . PT denies any noncompliance. PT states she took one extra of both today. PT A&OX4 . Pt states hx of HTN headaches

## 2017-09-17 ENCOUNTER — Ambulatory Visit: Payer: Medicaid Other | Admitting: Podiatry

## 2018-07-21 ENCOUNTER — Ambulatory Visit
Admission: EM | Admit: 2018-07-21 | Discharge: 2018-07-21 | Disposition: A | Discharge status: Home / Self Care | Payer: Medicaid Other | Attending: Family Medicine | Admitting: Family Medicine

## 2018-07-21 DIAGNOSIS — J309 Allergic rhinitis, unspecified: Secondary | ICD-10-CM

## 2018-07-21 DIAGNOSIS — F17210 Nicotine dependence, cigarettes, uncomplicated: Secondary | ICD-10-CM | POA: Diagnosis not present

## 2018-07-21 MED ORDER — AMOXICILLIN-POT CLAVULANATE 875-125 MG PO TABS: 1.0000 | ORAL_TABLET | Freq: Two times a day (BID) | ORAL | 0 refills | Status: DC

## 2018-07-21 MED ORDER — IPRATROPIUM BROMIDE 0.06 % NA SOLN: 2.0000 | Freq: Four times a day (QID) | NASAL | 0 refills | Status: DC | PRN

## 2018-07-21 NOTE — ED Provider Notes (Signed)
[name]    CSN: [id number] Arrival date & time: 07/21/18  1158  History   Chief Complaint Chief Complaint  Patient presents with  . Sinusitis    APPT   HPI  48 year old female presents with the above complaint.  Patient reports that she has had ongoing sinus congestion and pain.  She reports clear mucus/nasal discharge.  She states that she has had it since 3/14.  Has recently been improving with over-the-counter Claritin.  She this is predominantly allergy related.  No reports of purulent nasal discharge.  She does report pain/pressure intermittently in the right maxillary region as well as the frontal region.  No fever.  No other complaints.   PMH, Surgical Hx, Family Hx, Social History reviewed and updated as below.  Past Medical History:  Diagnosis Date  . Anxiety   . Arthritis   . Depression   . Hypertension   . Plantar fascia syndrome     Patient Active Problem List   Diagnosis Date Noted  . Laryngopharyngeal reflux (LPR) 07/18/2016  . Thyroid nodule 07/18/2016  . Anxiety and depression 05/20/2016  . Abnormal finding on thyroid function test 07/20/2012  . Acid reflux 07/20/2012  . BP (high blood pressure) 07/20/2012  . Multinodular goiter 07/20/2012    Past Surgical History:  Procedure Laterality Date  . DILATION AND CURETTAGE OF UTERUS      OB History   No obstetric history on file.    Home Medications    Prior to Admission medications   Medication Sig Start Date End Date Taking? Authorizing Provider  esomeprazole (NEXIUM) 20 MG capsule Take by mouth daily. 07/07/18  Yes [provider]  gabapentin (NEURONTIN) 100 MG capsule Take 200 mg by mouth at bedtime. 06/01/18  Yes [provider]  hydrochlorothiazide (HYDRODIURIL) 25 MG tablet Take 0.5 tablets (12.5 mg total) daily by mouth. 02/24/17  Yes [name], NP  lisinopril (PRINIVIL,ZESTRIL) 10 MG tablet Take 1 tablet (10 mg total) by mouth daily. 09/01/17 09/01/18 Yes  Orbie Pyo, MD  propranolol (INDERAL) 20 MG tablet  04/29/17  Yes [provider]  traZODone (DESYREL) 50 MG tablet TAKE 1 2 TABLETS (50 100 MG TOTAL) BY MOUTH NIGHTLY. 07/13/18  Yes [provider]  albuterol (PROVENTIL HFA;VENTOLIN HFA) 108 (90 Base) MCG/ACT inhaler Inhale into the lungs. 06/04/16   [provider]  amoxicillin-clavulanate (AUGMENTIN) 875-125 MG tablet Take 1 tablet by mouth every 12 (twelve) hours. 07/21/18   Coral Spikes, DO  citalopram (CELEXA) 20 MG tablet Take by mouth.    [provider]  ibuprofen (ADVIL,MOTRIN) 600 MG tablet  06/24/16   [provider]  ipratropium (ATROVENT) 0.06 % nasal spray Place 2 sprays into both nostrils 4 (four) times daily as needed for rhinitis. 07/21/18   Coral Spikes, DO  omeprazole (PRILOSEC) 20 MG capsule  04/16/17   [provider]    Family History Family History  Problem Relation Age of Onset  . Hypertension Maternal Grandmother   . Cancer Maternal Grandfather     Social History Social History   Tobacco Use  . Smoking status: Current Every Day Smoker  . Smokeless tobacco: Never Used  . Tobacco comment: puff or two everyday  Substance Use Topics  . Alcohol use: Yes    Alcohol/week: 0.0 standard drinks    Comment: socially  . Drug use: No     Allergies   Patient has no known allergies.   Review of Systems Review of  Systems  Constitutional: Negative for fever.  HENT: Positive for congestion, sinus pressure and sinus pain.   Respiratory:       Slight cough.   Physical Exam Triage Vital Signs ED Triage Vitals [07/21/18 1222]  Enc Vitals Group     BP 126/80     Pulse Rate 74     Resp 18     Temp 98.1 F (36.7 C)     Temp Source Oral     SpO2 100 %     Weight 190 lb (86.2 kg)     Height 5\' 7"  (1.702 m)     Head Circumference      Peak Flow      Pain Score 0     Pain Loc      Pain Edu?      Excl. in [place]?    Updated Vital Signs BP 126/80 (BP  Location: Left Arm)   Pulse 74   Temp 98.1 F (36.7 C) (Oral)   Resp 18   Ht 5\' 7"  (1.702 m)   Wt 86.2 kg   LMP 06/02/2018 (Exact Date)   SpO2 100%   BMI 29.76 kg/m   Visual Acuity Right Eye Distance:   Left Eye Distance:   Bilateral Distance:    Right Eye Near:   Left Eye Near:    Bilateral Near:     Physical Exam Vitals signs and nursing note reviewed.  Constitutional:      General: She is not in acute distress.    Appearance: Normal appearance.  HENT:     Head: Normocephalic and atraumatic.     Right Ear: Tympanic membrane normal.     Left Ear: Tympanic membrane normal.     Nose: No rhinorrhea.     Mouth/Throat:     Pharynx: Oropharynx is clear. No oropharyngeal exudate or posterior oropharyngeal erythema.  Cardiovascular:     Rate and Rhythm: Normal rate and regular rhythm.  Pulmonary:     Effort: Pulmonary effort is normal.     Breath sounds: Normal breath sounds.  Neurological:     Mental Status: She is alert.  Psychiatric:        Mood and Affect: Mood normal.        Behavior: Behavior normal.    UC Treatments / Results  Labs (all labs ordered are listed, but only abnormal results are displayed) Labs Reviewed - No data to display  EKG None  Radiology No results found.  Procedures Procedures (including critical care time)  Medications Ordered in UC Medications - No data to display  Initial Impression / Assessment and Plan / UC Course  I have reviewed the triage vital signs and the nursing notes.  Pertinent labs & imaging results that were available during my care of the patient were reviewed by me and considered in my medical decision making (see chart for details).    48 year old female presents with allergic rhinosinusitis.  Continue use of Claritin.  Atrovent nasal spray as prescribed.  If she fails to improve or worsens, she can start the Augmentin.  Final Clinical Impressions(s) / UC Diagnoses   Final diagnoses:  Allergic sinusitis      Discharge Instructions     Medication as prescribed.  Fill the antibiotic if symptoms persist.  Continue claritin.  Take care  Dr. Lacinda Axon    ED Prescriptions    Medication Sig Dispense Auth. Provider   ipratropium (ATROVENT) 0.06 % nasal spray Place 2 sprays into both nostrils 4 (four)  times daily as needed for rhinitis. 15 mL Bronsen Serano G, DO   amoxicillin-clavulanate (AUGMENTIN) 875-125 MG tablet Take 1 tablet by mouth every 12 (twelve) hours. 14 tablet Coral Spikes, DO     Controlled Substance Prescriptions [place] Controlled Substance Registry consulted? Not Applicable   Coral Spikes, DO 07/21/18 1257

## 2018-07-21 NOTE — ED Triage Notes (Signed)
Since a few weeks just Sinus infection with cough, right facial pain, headache and wanted to make sure not a sinus infection. No fever reported. Has been taking sudafed PRN and has been helping .

## 2018-07-21 NOTE — Discharge Instructions (Signed)
Medication as prescribed.  Fill the antibiotic if symptoms persist.  Continue claritin.  Take care  Dr. Lacinda Axon

## 2018-08-27 ENCOUNTER — Other Ambulatory Visit: Payer: Self-pay | Admitting: Family Medicine

## 2019-03-01 ENCOUNTER — Ambulatory Visit
Admission: EM | Admit: 2019-03-01 | Discharge: 2019-03-01 | Disposition: A | Discharge status: Home / Self Care | Payer: Medicaid Other | Attending: Family Medicine | Admitting: Family Medicine

## 2019-03-01 ENCOUNTER — Encounter: Payer: Self-pay | Admitting: Emergency Medicine

## 2019-03-01 ENCOUNTER — Other Ambulatory Visit: Payer: Self-pay

## 2019-03-01 DIAGNOSIS — H579 Unspecified disorder of eye and adnexa: Secondary | ICD-10-CM

## 2019-03-01 MED ORDER — POLYMYXIN B-TRIMETHOPRIM 10000-0.1 UNIT/ML-% OP SOLN: 1.0000 [drp] | Freq: Four times a day (QID) | OPHTHALMIC | 0 refills | Status: AC

## 2019-03-01 MED ORDER — KETOROLAC TROMETHAMINE 0.5 % OP SOLN: 1.0000 [drp] | Freq: Four times a day (QID) | OPHTHALMIC | 0 refills | Status: DC

## 2019-03-01 NOTE — ED Provider Notes (Signed)
[name]    CSN: [id number] Arrival date & time: 03/01/19  0802  History   Chief Complaint Chief Complaint  Patient presents with  . Eye Pain    right   HPI  48 year old female presents with eye pain.  Patient reports that approximately 3 days ago she woke up with a foreign body sensation in her right eye.  Patient states that she was outdoors but is unsure of a discrete foreign body that got in her eye.  She has flushed her eye out multiple times without resolution.  She endorses right eye redness.  Rates her pain as 2/10 in severity.  Denies discharge.  No vision change.  No known exacerbating or relieving factors.  No other associated symptoms.  No other complaints.  PMH, Surgical Hx, Family Hx, Social History reviewed and updated as below.  Past Medical History:  Diagnosis Date  . Anxiety   . Arthritis   . Depression   . Hypertension   . Plantar fascia syndrome    Patient Active Problem List   Diagnosis Date Noted  . Laryngopharyngeal reflux (LPR) 07/18/2016  . Thyroid nodule 07/18/2016  . Anxiety and depression 05/20/2016  . Abnormal finding on thyroid function test 07/20/2012  . Acid reflux 07/20/2012  . BP (high blood pressure) 07/20/2012  . Multinodular goiter 07/20/2012   Past Surgical History:  Procedure Laterality Date  . DILATION AND CURETTAGE OF UTERUS     OB History   No obstetric history on file.    Home Medications    Prior to Admission medications   Medication Sig Start Date End Date Taking? Authorizing Provider  esomeprazole (NEXIUM) 20 MG capsule Take by mouth daily. 07/07/18  Yes [provider]  gabapentin (NEURONTIN) 100 MG capsule Take 200 mg by mouth at bedtime. 06/01/18  Yes [provider]  ibuprofen (ADVIL,MOTRIN) 600 MG tablet  06/24/16  Yes [provider]  lisinopril (PRINIVIL,ZESTRIL) 10 MG tablet Take 1 tablet (10 mg total) by mouth daily. 09/01/17 03/01/19 Yes Orbie Pyo, MD   propranolol (INDERAL) 20 MG tablet  04/29/17  Yes [provider]  traZODone (DESYREL) 50 MG tablet TAKE 1 2 TABLETS (50 100 MG TOTAL) BY MOUTH NIGHTLY. 07/13/18  Yes [provider]  ketorolac (ACULAR) 0.5 % ophthalmic solution Place 1 drop into the right eye every 6 (six) hours. 03/01/19   Coral Spikes, DO  trimethoprim-polymyxin b (POLYTRIM) ophthalmic solution Place 1 drop into the right eye every 6 (six) hours for 5 days. 03/01/19 03/06/19  Coral Spikes, DO  hydrochlorothiazide (HYDRODIURIL) 25 MG tablet Take 0.5 tablets (12.5 mg total) daily by mouth. 02/24/17 03/01/19  [name], NP  ipratropium (ATROVENT) 0.06 % nasal spray Place 2 sprays into both nostrils 4 (four) times daily as needed for rhinitis. 07/21/18 03/01/19  Coral Spikes, DO  omeprazole (PRILOSEC) 20 MG capsule  04/16/17 03/01/19  [provider]    Family History Family History  Problem Relation Age of Onset  . Hypertension Maternal Grandmother   . Cancer Maternal Grandfather     Social History Social History   Tobacco Use  . Smoking status: Current Every Day Smoker  . Smokeless tobacco: Never Used  . Tobacco comment: puff or two everyday  Substance Use Topics  . Alcohol use: Yes    Alcohol/week: 0.0 standard drinks    Comment: socially  . Drug use: No     Allergies   Patient has no known allergies.  Review of Systems Review of Systems  Constitutional: Negative.   Eyes: Positive for pain and redness. Negative for discharge and visual disturbance.   Physical Exam Triage Vital Signs ED Triage Vitals  Enc Vitals Group     BP 03/01/19 0824 (!) 145/100     Pulse Rate 03/01/19 0824 80     Resp 03/01/19 0824 18     Temp 03/01/19 0824 98.4 F (36.9 C)     Temp Source 03/01/19 0824 Oral     SpO2 03/01/19 0824 98 %     Weight 03/01/19 0821 190 lb (86.2 kg)     Height 03/01/19 0821 5\' 7"  (1.702 m)     Head Circumference --      Peak Flow --      Pain Score 03/01/19 0820 2      Pain Loc --      Pain Edu? --      Excl. in [place]? --    Updated Vital Signs BP (!) 145/100 (BP Location: Left Arm)   Pulse 80   Temp 98.4 F (36.9 C) (Oral)   Resp 18   Ht 5\' 7"  (1.702 m)   Wt 86.2 kg   LMP 02/25/2019 (Approximate)   SpO2 98%   BMI 29.76 kg/m   Visual Acuity Right Eye Distance: 20/25(corrected) Left Eye Distance: 20/20(corrected) Bilateral Distance: 20/20(corrected)  Right Eye Near:   Left Eye Near:    Bilateral Near:     Physical Exam Constitutional:      General: She is not in acute distress.    Appearance: Normal appearance. She is not ill-appearing.  HENT:     Head: Normocephalic and atraumatic.  Eyes:     General:        Right eye: No discharge.        Left eye: No discharge.     Conjunctiva/sclera: Conjunctivae normal.     Comments: Fluorescein exam negative.   Pulmonary:     Effort: Pulmonary effort is normal. No respiratory distress.  Skin:    General: Skin is warm.     Findings: No rash.  Neurological:     General: No focal deficit present.     Mental Status: She is alert and oriented to person, place, and time.  Psychiatric:        Mood and Affect: Mood normal.        Behavior: Behavior normal.    UC Treatments / Results  Labs (all labs ordered are listed, but only abnormal results are displayed) Labs Reviewed - No data to display  EKG   Radiology No results found.  Procedures Procedures (including critical care time)  Medications Ordered in UC Medications - No data to display  Initial Impression / Assessment and Plan / UC Course  I have reviewed the triage vital signs and the nursing notes.  Pertinent labs & imaging results that were available during my care of the patient were reviewed by me and considered in my medical decision making (see chart for details).    48 year old female presents with a foreign body sensation in her right eye.  Exam unremarkable.  Placing on Polytrim and ketorolac eyedrops.  Final  Clinical Impressions(s) / UC Diagnoses   Final diagnoses:  Sensation of foreign body in eye     Discharge Instructions     Eye drops as prescribed.  If persists, call [facility].  Take care  Dr. Lacinda Axon    ED Prescriptions    Medication Sig Dispense Auth.  Provider   trimethoprim-polymyxin b (POLYTRIM) ophthalmic solution Place 1 drop into the right eye every 6 (six) hours for 5 days. 10 mL Rehanna Oloughlin G, DO   ketorolac (ACULAR) 0.5 % ophthalmic solution Place 1 drop into the right eye every 6 (six) hours. 5 mL Coral Spikes, DO     PDMP not reviewed this encounter.   Thersa Salt [place], [place] 03/01/19 [phone]

## 2019-03-01 NOTE — Discharge Instructions (Signed)
Eye drops as prescribed.  If persists, call [place].  Take care  Dr. Lacinda Axon

## 2019-03-01 NOTE — ED Triage Notes (Signed)
Pt c/o right eye pain. She states that it feels like there is something in her eye. She has a red area in the inner corner of her eye started about 3 days ago. She has tried to flush out her eye and warm compresses without relief.

## 2020-05-04 ENCOUNTER — Other Ambulatory Visit: Payer: Self-pay

## 2020-05-04 ENCOUNTER — Ambulatory Visit
Admission: EM | Admit: 2020-05-04 | Discharge: 2020-05-04 | Disposition: A | Discharge status: Home / Self Care | Payer: Medicaid Other | Attending: Sports Medicine | Admitting: Sports Medicine

## 2020-05-04 DIAGNOSIS — R319 Hematuria, unspecified: Secondary | ICD-10-CM | POA: Diagnosis not present

## 2020-05-04 DIAGNOSIS — R109 Unspecified abdominal pain: Secondary | ICD-10-CM | POA: Diagnosis not present

## 2020-05-04 DIAGNOSIS — R110 Nausea: Secondary | ICD-10-CM | POA: Diagnosis present

## 2020-05-04 LAB — URINALYSIS, COMPLETE (UACMP) WITH MICROSCOPIC
Bilirubin Urine: NEGATIVE
Glucose, UA: NEGATIVE mg/dL
Ketones, ur: NEGATIVE mg/dL
Leukocytes,Ua: NEGATIVE
Nitrite: NEGATIVE
Protein, ur: NEGATIVE mg/dL
RBC / HPF: NONE SEEN RBC/hpf (ref 0–5)
Specific Gravity, Urine: 1.02 (ref 1.005–1.030)
pH: 8.5 — ABNORMAL HIGH (ref 5.0–8.0)

## 2020-05-04 LAB — COMPREHENSIVE METABOLIC PANEL
ALT: 65 U/L — ABNORMAL HIGH (ref 0–44)
AST: 30 U/L (ref 15–41)
Albumin: 4.4 g/dL (ref 3.5–5.0)
Alkaline Phosphatase: 69 U/L (ref 38–126)
Anion gap: 2 — ABNORMAL LOW (ref 5–15)
BUN: 13 mg/dL (ref 6–20)
CO2: 34 mmol/L — ABNORMAL HIGH (ref 22–32)
Calcium: 9 mg/dL (ref 8.9–10.3)
Chloride: 102 mmol/L (ref 98–111)
Creatinine, Ser: 0.78 mg/dL (ref 0.44–1.00)
GFR, Estimated: >60 mL/min (ref >60)
Glucose, Bld: 97 mg/dL (ref 70–99)
Potassium: 3.9 mmol/L (ref 3.5–5.1)
Sodium: 138 mmol/L (ref 135–145)
Total Bilirubin: 0.3 mg/dL (ref 0.3–1.2)
Total Protein: 7.6 g/dL (ref 6.5–8.1)

## 2020-05-04 LAB — CBC WITH DIFFERENTIAL/PLATELET
Abs Immature Granulocytes: 0.01 10*3/uL (ref 0.00–0.07)
Basophils Absolute: 0 10*3/uL (ref 0.0–0.1)
Basophils Relative: 0 %
Eosinophils Absolute: 0.2 10*3/uL (ref 0.0–0.5)
Eosinophils Relative: 2 %
HCT: 39.3 % (ref 36.0–46.0)
Hemoglobin: 13 g/dL (ref 12.0–15.0)
Immature Granulocytes: 0 %
Lymphocytes Relative: 54 %
Lymphs Abs: 4 10*3/uL (ref 0.7–4.0)
MCH: 28 pg (ref 26.0–34.0)
MCHC: 33.1 g/dL (ref 30.0–36.0)
MCV: 84.5 fL (ref 80.0–100.0)
Monocytes Absolute: 0.6 10*3/uL (ref 0.1–1.0)
Monocytes Relative: 8 %
Neutro Abs: 2.7 10*3/uL (ref 1.7–7.7)
Neutrophils Relative %: 36 %
Platelets: 389 10*3/uL (ref 150–400)
RBC: 4.65 MIL/uL (ref 3.87–5.11)
RDW: 12.8 % (ref 11.5–15.5)
WBC: 7.5 10*3/uL (ref 4.0–10.5)
nRBC: 0 % (ref 0.0–0.2)

## 2020-05-04 LAB — PREGNANCY, URINE: Preg Test, Ur: NEGATIVE

## 2020-05-04 NOTE — Discharge Instructions (Addendum)
Recommended we get a UA.  Showed a few bacteria and a little blood but no nitrites or leukocytes.  We will send it off for culture given the bacteria that are present. We will get a CBC and a c-Met.  No elevation in her white count.  Her ALT was elevated at 65.  AST was normal.  Urine pregnancy test was negative.  UA did show hematuria. I discussed all the labs with the patient.  I did not feel that she emergently needed to go to the emergency room.  However, I do feel to complete her work-up and be thorough she will need a CT of her abdomen to rule out an intra-abdominal cause to all of her pain including kidney stones or some other process including possible appendicitis.  Certainly this can be an atypical presentation of shingles without the rash.  For that I recommend she take gabapentin 300 mg tonight just to see if she gets any relief at all. Educational handouts were provided. Over-the-counter meds as needed, Tylenol or Motrin for fever discomfort. 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  I have asked her to call her primary care physician to see if they will order a CT scan to rule out intra abdominal pathology and hopefully she can get that tomorrow when she is off work. Plenty of fluids plenty of rest. I will discharge her and ask her primary care physician to take over management.  She will follow-up here as needed.

## 2020-05-04 NOTE — ED Triage Notes (Addendum)
Patient complains of mid back pain and right flank pain x 3 days. States that the pain is worse when laying down.

## 2020-05-04 NOTE — ED Provider Notes (Signed)
[name]    CSN: [id number] Arrival date & time: 05/04/20  1448      History   Chief Complaint Chief Complaint  Patient presents with  . Flank Pain    HPI Kara Cisneros is a 50 y.o. female.   Pleasant 50 year old female who presents for evaluation of 5 days of right-sided flank pain that radiates around anteriorly just underneath her right rib.  She has not noticed any rash.  She has had 1 UTI in her life.  No kidney stone history.  She questions whether or not she had some blood in the urine but she was on her menses.  She noted that it was quite heavy this time.  She thinks she is heading into menopause.  No vaginal discharge or vaginal pain.  She has some slight nausea.  It seems as though her symptoms are worse if she lays flat.  Symptoms have actually been there for about a week now.  No accidents trauma or falls.  No nausea vomiting or diarrhea.  She did have chickenpox as an adult but has not received the shingles vaccine.  She does have hypertension anxiety and depression.  Med list as above.  She works in an office at [employer].  No fever shakes chills.  Her primary care physician is through the [facility] system.  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  She did have COVID in December and has been vaccinated but no booster.  He has also received her influenza vaccine.  She denies any significant dysuria or increased frequency or urgency.  She has not noticed a rash.     Past Medical History:  Diagnosis Date  . Anxiety   . Arthritis   . Depression   . Hypertension   . Plantar fascia syndrome     Patient Active Problem List   Diagnosis Date Noted  . Laryngopharyngeal reflux (LPR) 07/18/2016  . Thyroid nodule 07/18/2016  . Anxiety and depression 05/20/2016  . Abnormal finding on thyroid function test 07/20/2012  . Acid reflux 07/20/2012  . BP (high blood pressure) 07/20/2012  . Multinodular goiter 07/20/2012    Past  Surgical History:  Procedure Laterality Date  . DILATION AND CURETTAGE OF UTERUS      OB History   No obstetric history on file.      Home Medications    Prior to Admission medications   Medication Sig Start Date End Date Taking? Authorizing Provider  esomeprazole (NEXIUM) 20 MG capsule Take by mouth daily. 07/07/18  Yes [provider]  propranolol (INDERAL) 40 MG tablet 40 mg. 04/29/17  Yes [provider]  citalopram (CELEXA) 10 MG tablet Take 10 mg by mouth daily. 04/12/20   [provider]  gabapentin (NEURONTIN) 100 MG capsule Take 200 mg by mouth at bedtime. 06/01/18   [provider]  ibuprofen (ADVIL,MOTRIN) 600 MG tablet  06/24/16   [provider]  ketorolac (ACULAR) 0.5 % ophthalmic solution Place 1 drop into the right eye every 6 (six) hours. 03/01/19   Coral Spikes, DO  lisinopril (PRINIVIL,ZESTRIL) 10 MG tablet Take 1 tablet (10 mg total) by mouth daily. Patient taking differently: Take 20 mg by mouth daily. 09/01/17 03/01/19  Orbie Pyo, MD  traZODone (DESYREL) 50 MG tablet TAKE 1 2 TABLETS (50 100 MG TOTAL) BY MOUTH NIGHTLY. 07/13/18   [provider]  hydrochlorothiazide (HYDRODIURIL) 25 MG tablet Take 0.5 tablets (12.5 mg total) daily by  mouth. 02/24/17 03/01/19  [name], NP  ipratropium (ATROVENT) 0.06 % nasal spray Place 2 sprays into both nostrils 4 (four) times daily as needed for rhinitis. 07/21/18 03/01/19  Coral Spikes, DO  omeprazole (PRILOSEC) 20 MG capsule  04/16/17 03/01/19  [provider]    Family History Family History  Problem Relation Age of Onset  . Hypertension Maternal Grandmother   . Cancer Maternal Grandfather     Social History Social History   Tobacco Use  . Smoking status: Current Every Day Smoker  . Smokeless tobacco: Never Used  . Tobacco comment: puff or two everyday  Vaping Use  . Vaping Use: Never used  Substance Use Topics  . Alcohol use: Yes     Alcohol/week: 0.0 standard drinks    Comment: socially  . Drug use: No     Allergies   Patient has no known allergies.   Review of Systems Review of Systems  Constitutional: Positive for activity change. Negative for appetite change, chills, diaphoresis and fever.  HENT: Negative.   Eyes: Negative.   Respiratory: Negative.   Cardiovascular: Negative.   Gastrointestinal: Positive for abdominal pain and nausea. Negative for abdominal distention, blood in stool, diarrhea and vomiting.  Endocrine: Negative.   Genitourinary: Positive for flank pain. Negative for decreased urine volume, difficulty urinating, dysuria, frequency, hematuria, menstrual problem, pelvic pain, urgency, vaginal bleeding, vaginal discharge and vaginal pain.  Skin: Negative for color change, pallor, rash and wound.  Neurological: Negative for dizziness, weakness, light-headedness, numbness and headaches.  All other systems reviewed and are negative.    Physical Exam Triage Vital Signs ED Triage Vitals  Enc Vitals Group     BP 05/04/20 1550 (!) 144/100     Pulse Rate 05/04/20 1550 (!) 56     Resp 05/04/20 1550 18     Temp 05/04/20 1550 98.4 F (36.9 C)     Temp Source 05/04/20 1550 Oral     SpO2 05/04/20 1550 97 %     Weight 05/04/20 1546 195 lb (88.5 kg)     Height 05/04/20 1546 5' 7" (1.702 m)     Head Circumference --      Peak Flow --      Pain Score --      Pain Loc --      Pain Edu? --      Excl. in [place]? --    No data found.  Updated Vital Signs BP (!) 144/100 (BP Location: Left Arm)   Pulse (!) 56   Temp 98.4 F (36.9 C) (Oral)   Resp 18   Ht 5' 7" (1.702 m)   Wt 88.5 kg   LMP 04/24/2020   SpO2 97%   BMI 30.54 kg/m   Visual Acuity Right Eye Distance:   Left Eye Distance:   Bilateral Distance:    Right Eye Near:   Left Eye Near:    Bilateral Near:     Physical Exam Vitals and nursing note reviewed.  Constitutional:      General: She is not in acute distress.     Appearance: Normal appearance. She is not ill-appearing or toxic-appearing.     Comments: Patient is uncomfortable during the exam but in no acute distress.  She is nontoxic and non-ill-appearing.  HENT:     Head: Normocephalic and atraumatic.     Nose: Nose normal.     Mouth/Throat:     Mouth: Mucous membranes are moist.     Pharynx: No oropharyngeal  exudate or posterior oropharyngeal erythema.  Eyes:     Extraocular Movements: Extraocular movements intact.     Conjunctiva/sclera: Conjunctivae normal.     Pupils: Pupils are equal, round, and reactive to light.  Cardiovascular:     Rate and Rhythm: Normal rate and regular rhythm.     Pulses: Normal pulses.     Heart sounds: Normal heart sounds. No murmur heard. No friction rub. No gallop.   Pulmonary:     Effort: Pulmonary effort is normal. No respiratory distress.     Breath sounds: Normal breath sounds. No stridor. No wheezing, rhonchi or rales.  Abdominal:     General: Bowel sounds are normal. There is no distension.     Palpations: Abdomen is soft. There is no mass.     Tenderness: There is abdominal tenderness. There is right CVA tenderness.     Comments: Patient does have global tenderness to palpation but mostly in the right upper quadrant and right lower quadrant.  That said her abdomen is soft.  She guards a little bit on exam but there is no definitive rebound tenderness.  Musculoskeletal:     Cervical back: Normal range of motion and neck supple. No rigidity.  Skin:    General: Skin is warm and dry.     Capillary Refill: Capillary refill takes less than 2 seconds.     Findings: No bruising, erythema, lesion or rash.     Comments: No evidence of a shingles rash.  Neurological:     General: No focal deficit present.     Mental Status: She is alert and oriented to person, place, and time.  Psychiatric:        Mood and Affect: Mood normal.        Behavior: Behavior normal.      UC Treatments / Results  Labs (all  labs ordered are listed, but only abnormal results are displayed) Labs Reviewed  URINALYSIS, COMPLETE (UACMP) WITH MICROSCOPIC - Abnormal; Notable for the following components:      Result Value   pH 8.5 (*)    Hgb urine dipstick TRACE (*)    Bacteria, UA FEW (*)    All other components within normal limits  COMPREHENSIVE METABOLIC PANEL - Abnormal; Notable for the following components:   CO2 34 (*)    ALT 65 (*)    Anion gap 2 (*)    All other components within normal limits  URINE CULTURE  CBC WITH DIFFERENTIAL/PLATELET  PREGNANCY, URINE    EKG   Radiology No results found.  Procedures Procedures (including critical care time)  Medications Ordered in UC Medications - No data to display  Initial Impression / Assessment and Plan / UC Course  I have reviewed the triage vital signs and the nursing notes.  Pertinent labs & imaging results that were available during my care of the patient were reviewed by me and considered in my medical decision making (see chart for details).  Clinical impression: 1 week of right-sided flank pain radiating into her rib cage.  On the differential is shingles versus an intra-abdominal process given her abdominal exam versus renal stones versus a urinary tract infection  Treatment plan: 1.  The findings and treatment plan were discussed in detail with the patient.  Patient was in agreement. 2.  Recommended we get a UA.  Showed a few bacteria and a little blood but no nitrites or leukocytes.  We will send it off for culture given the bacteria that are present.  3.  We will get a CBC and a c-Met.  No elevation in her white count.  Her ALT was elevated at 65.  AST was normal.  Urine pregnancy test was negative.  UA did show hematuria. 4.  I discussed all the labs with the patient.  I did not feel that she emergently needed to go to the emergency room.  However, I do feel to complete her work-up and be thorough she will need a CT of her abdomen to rule  out an intra-abdominal cause to all of her pain including kidney stones or some other process including possible appendicitis.  Certainly this can be an atypical presentation of shingles without the rash.  For that I recommend she take gabapentin 300 mg tonight just to see if she gets any relief at all. 5. Educational handouts were provided. 6.  Over-the-counter meds as needed, Tylenol or Motrin for fever discomfort. 7.  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  I have asked her to call her primary care physician to see if they will order a CT scan to rule out intra abdominal pathology and hopefully she can get that tomorrow when she is off work. 8.  Plenty of fluids plenty of rest. 9.  I will discharge her and ask her primary care physician to take over management.  She will follow-up here as needed.    Final Clinical Impressions(s) / UC Diagnoses   Final diagnoses:  Right flank pain  Nausea without vomiting  Hematuria, unspecified type     Discharge Instructions     Recommended we get a UA.  Showed a few bacteria and a little blood but no nitrites or leukocytes.  We will send it off for culture given the bacteria that are present. We will get a CBC and a c-Met.  No elevation in her white count.  Her ALT was elevated at 65.  AST was normal.  Urine pregnancy test was negative.  UA did show hematuria. I discussed all the labs with the patient.  I did not feel that she emergently needed to go to the emergency room.  However, I do feel to complete her work-up and be thorough she will need a CT of her abdomen to rule out an intra-abdominal cause to all of her pain including kidney stones or some other process including possible appendicitis.  Certainly this can be an atypical presentation of shingles without the rash.  For that I recommend she take gabapentin 300 mg tonight just to see if she gets any relief at all. Educational handouts were provided. Over-the-counter meds as needed,  Tylenol or Motrin for fever discomfort. 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  I have asked her to call her primary care physician to see if they will order a CT scan to rule out intra abdominal pathology and hopefully she can get that tomorrow when she is off work. Plenty of fluids plenty of rest. I will discharge her and ask her primary care physician to take over management.  She will follow-up here as needed.    ED Prescriptions    None     PDMP not reviewed this encounter.   Verda Cumins, MD 05/04/20 1901

## 2020-05-06 LAB — URINE CULTURE: Culture: NO GROWTH

## 2021-03-23 ENCOUNTER — Ambulatory Visit
Admission: EM | Admit: 2021-03-23 | Discharge: 2021-03-23 | Disposition: A | Discharge status: Home / Self Care | Payer: Medicaid Other | Attending: Emergency Medicine | Admitting: Emergency Medicine

## 2021-03-23 ENCOUNTER — Other Ambulatory Visit: Payer: Self-pay

## 2021-03-23 DIAGNOSIS — R0981 Nasal congestion: Secondary | ICD-10-CM

## 2021-03-23 DIAGNOSIS — J40 Bronchitis, not specified as acute or chronic: Secondary | ICD-10-CM | POA: Diagnosis not present

## 2021-03-23 DIAGNOSIS — R051 Acute cough: Secondary | ICD-10-CM

## 2021-03-23 MED ORDER — PREDNISONE 10 MG (21) PO TBPK: ORAL_TABLET | Freq: Every day | ORAL | 0 refills | Status: DC

## 2021-03-23 MED ORDER — FLUTICASONE PROPIONATE 50 MCG/ACT NA SUSP: 2.0000 | Freq: Every day | NASAL | 2 refills | Status: AC

## 2021-03-23 MED ORDER — ALBUTEROL SULFATE HFA 108 (90 BASE) MCG/ACT IN AERS: 1.0000 | INHALATION_SPRAY | Freq: Four times a day (QID) | RESPIRATORY_TRACT | 0 refills | Status: DC | PRN

## 2021-03-23 MED ORDER — AZITHROMYCIN 250 MG PO TABS: 250.0000 mg | ORAL_TABLET | Freq: Once | ORAL | 0 refills | Status: AC

## 2021-03-23 NOTE — ED Triage Notes (Signed)
Pt here with C/O cough and chest congestion, started getting sick last Tuesday(10days ago), felt like flu. States that cough, sinus headache, and nasal congestion are getting worst.

## 2021-03-23 NOTE — ED Provider Notes (Signed)
[name]    CSN: [id number] Arrival date & time: 03/23/21  0801      History   Chief Complaint Chief Complaint  Patient presents with   Cough   Nasal Congestion    HPI Kara Cisneros is a 50 y.o. female.   Cough congestion, nasal pressure and slight headache intermit for 10 days now. Cough is worse at night. Pt taking night quil while some releif.denies any fevers. No chest pain , no sob.    Past Medical History:  Diagnosis Date   Anxiety    Arthritis    Depression    Hypertension    Plantar fascia syndrome     Patient Active Problem List   Diagnosis Date Noted   Laryngopharyngeal reflux (LPR) 07/18/2016   Thyroid nodule 07/18/2016   Anxiety and depression 05/20/2016   Abnormal finding on thyroid function test 07/20/2012   Acid reflux 07/20/2012   BP (high blood pressure) 07/20/2012   Multinodular goiter 07/20/2012    Past Surgical History:  Procedure Laterality Date   DILATION AND CURETTAGE OF UTERUS      OB History   No obstetric history on file.      Home Medications    Prior to Admission medications   Medication Sig Start Date End Date Taking? Authorizing Provider  albuterol (VENTOLIN HFA) 108 (90 Base) MCG/ACT inhaler Inhale 1-2 puffs into the lungs every 6 (six) hours as needed for wheezing or shortness of breath. 03/23/21  Yes Marney Setting, NP  azithromycin (ZITHROMAX Z-PAK) 250 MG tablet Take 1 tablet (250 mg total) by mouth once for 1 dose. 03/23/21 03/23/21 Yes Marney Setting, NP  esomeprazole (NEXIUM) 20 MG capsule Take by mouth daily. 07/07/18  Yes [provider]  fluticasone (FLONASE) 50 MCG/ACT nasal spray Place 2 sprays into both nostrils daily. 03/23/21  Yes Marney Setting, NP  ibuprofen (ADVIL,MOTRIN) 600 MG tablet  06/24/16  Yes [provider]  lisinopril (PRINIVIL,ZESTRIL) 10 MG tablet Take 1 tablet (10 mg total) by mouth daily. Patient taking differently: Take 20 mg by mouth  daily. 09/01/17 03/23/21 Yes Schaevitz, Randall An, MD  predniSONE (STERAPRED UNI-PAK 21 TAB) 10 MG (21) TBPK tablet Take by mouth daily. Take 6 tabs by mouth daily  for 2 days, then 5 tabs for 2 days, then 4 tabs for 2 days, then 3 tabs for 2 days, 2 tabs for 2 days, then 1 tab by mouth daily for 2 days 03/23/21  Yes Marney Setting, NP  propranolol (INDERAL) 40 MG tablet 40 mg. 04/29/17  Yes [provider]  citalopram (CELEXA) 10 MG tablet Take 10 mg by mouth daily. 04/12/20   [provider]  gabapentin (NEURONTIN) 100 MG capsule Take 200 mg by mouth at bedtime. 06/01/18   [provider]  ketorolac (ACULAR) 0.5 % ophthalmic solution Place 1 drop into the right eye every 6 (six) hours. 03/01/19   Coral Spikes, DO  traZODone (DESYREL) 50 MG tablet TAKE 1 2 TABLETS (50 100 MG TOTAL) BY MOUTH NIGHTLY. 07/13/18   [provider]  hydrochlorothiazide (HYDRODIURIL) 25 MG tablet Take 0.5 tablets (12.5 mg total) daily by mouth. 02/24/17 03/01/19  [name], NP  ipratropium (ATROVENT) 0.06 % nasal spray Place 2 sprays into both nostrils 4 (four) times daily as needed for rhinitis. 07/21/18 03/01/19  Coral Spikes, DO  omeprazole (PRILOSEC) 20 MG capsule  04/16/17 03/01/19  [provider]    Family History Family History  Problem Relation Age of Onset   Hypertension Maternal Grandmother    Cancer Maternal Grandfather     Social History Social History   Tobacco Use   Smoking status: Every Day   Smokeless tobacco: Never   Tobacco comments:    puff or two everyday  Vaping Use   Vaping Use: Never used  Substance Use Topics   Alcohol use: Yes    Alcohol/week: 0.0 standard drinks    Comment: socially   Drug use: No     Allergies   Patient has no known allergies.   Review of Systems Review of Systems  Constitutional:  Negative for chills, fatigue and fever.  HENT:  Positive for congestion, postnasal drip, rhinorrhea, sinus pressure, sinus  pain, sneezing and sore throat.   Eyes: Negative.   Respiratory:  Positive for cough and wheezing. Negative for shortness of breath.   Cardiovascular: Negative.   Gastrointestinal: Negative.   Genitourinary: Negative.   Musculoskeletal: Negative.   Skin: Negative.   Neurological:  Positive for headaches.    Physical Exam Triage Vital Signs ED Triage Vitals [03/23/21 0819]  Enc Vitals Group     BP      Pulse      Resp      Temp      Temp src      SpO2      Weight 200 lb (90.7 kg)     Height 5\' 7"  (1.702 m)     Head Circumference      Peak Flow      Pain Score 1     Pain Loc      Pain Edu?      Excl. in [place]?    No data found.  Updated Vital Signs BP (!) 147/96 (BP Location: Left Arm)   Pulse 70   Temp 98.8 F (37.1 C) (Oral)   Resp 18   Ht 5\' 7"  (1.702 m)   Wt 200 lb (90.7 kg)   LMP 03/02/2021   SpO2 99%   BMI 31.32 kg/m   Visual Acuity Right Eye Distance:   Left Eye Distance:   Bilateral Distance:    Right Eye Near:   Left Eye Near:    Bilateral Near:     Physical Exam Constitutional:      Appearance: Normal appearance.  HENT:     Right Ear: Tympanic membrane normal.     Left Ear: Tympanic membrane normal.     Nose: Congestion and rhinorrhea present.     Mouth/Throat:     Mouth: Mucous membranes are moist.  Eyes:     Pupils: Pupils are equal, round, and reactive to light.  Cardiovascular:     Rate and Rhythm: Normal rate.  Pulmonary:     Effort: Pulmonary effort is normal.     Breath sounds: Normal breath sounds.  Abdominal:     General: Abdomen is flat.  Musculoskeletal:     Cervical back: Normal range of motion.  Skin:    General: Skin is warm.     Capillary Refill: Capillary refill takes less than 2 seconds.  Neurological:     General: No focal deficit present.     Mental Status: She is alert.     UC Treatments / Results  Labs (all labs ordered are listed, but only abnormal results are displayed) Labs Reviewed - No data to  display  EKG   Radiology No results found.  Procedures Procedures (including critical care time)  Medications Ordered in UC  Medications - No data to display  Initial Impression / Assessment and Plan / UC Course  I have reviewed the triage vital signs and the nursing notes.  Pertinent labs & imaging results that were available during my care of the patient were reviewed by me and considered in my medical decision making (see chart for details).     Use NSAIDS as needed for pain or inflammation  Use inhaler and Flonase as needed for cough or nasal pressure  Will send in script just in case not better in 48 hours and then she can have this filled for sinusitis.  Use humidifier as needed Stay hydrated well Cont to take otc cough and cold meds as needed  If symptoms become worse go to er   Final Clinical Impressions(s) / UC Diagnoses   Final diagnoses:  Bronchitis  Acute cough  Nasal congestion     Discharge Instructions      Use NSAIDS as needed for pain or inflammation  Use inhaler and Flonase as needed for cough or nasal pressure  Will send in script just in case not better in 48 hours and then she can have this filled for sinusitis.  Use humidifier as needed Stay hydrated well Cont to take otc cough and cold meds as needed      ED Prescriptions     Medication Sig Dispense Auth. Provider   predniSONE (STERAPRED UNI-PAK 21 TAB) 10 MG (21) TBPK tablet Take by mouth daily. Take 6 tabs by mouth daily  for 2 days, then 5 tabs for 2 days, then 4 tabs for 2 days, then 3 tabs for 2 days, 2 tabs for 2 days, then 1 tab by mouth daily for 2 days 42 tablet Morley Kos L, NP   fluticasone (FLONASE) 50 MCG/ACT nasal spray Place 2 sprays into both nostrils daily. 16 g Marney Setting, NP   albuterol (VENTOLIN HFA) 108 (90 Base) MCG/ACT inhaler Inhale 1-2 puffs into the lungs every 6 (six) hours as needed for wheezing or shortness of breath. 90 g Morley Kos L,  NP   azithromycin (ZITHROMAX Z-PAK) 250 MG tablet Take 1 tablet (250 mg total) by mouth once for 1 dose. 6 tablet Marney Setting, NP      PDMP not reviewed this encounter.   Marney Setting, NP 03/23/21 [phone]

## 2021-03-23 NOTE — Discharge Instructions (Addendum)
Use NSAIDS as needed for pain or inflammation  Use inhaler and Flonase as needed for cough or nasal pressure  Will send in script just in case not better in 48 hours and then she can have this filled for sinusitis.  Use humidifier as needed Stay hydrated well Cont to take otc cough and cold meds as needed

## 2021-03-26 ENCOUNTER — Telehealth: Payer: Self-pay

## 2021-03-26 ENCOUNTER — Telehealth (HOSPITAL_COMMUNITY): Payer: Self-pay | Admitting: Emergency Medicine

## 2021-03-26 NOTE — Telephone Encounter (Signed)
[facility] reports that pt called because she only received 2 tablets of a Z-Pack that was prescribed by Alroy Dust, [place] on 03/24/2021; PA on duty wanted nursing to f/u with pt to check her symptoms before giving orders for further medication This nurse LM on VM asking for callback to [facility] at this time and at 1115

## 2021-03-26 NOTE — Telephone Encounter (Signed)
Received a voicemail from patient and upon return call she states she went to start her antibiotic and noticed there were only two pills in the bottle.  Reviewed the prescription that was sent and the instructions don't match up with the amount of pills prescribed, but the provider intended a full z-pack.  They did not even fill what the prescription sent.  Spoke to pharmacist who noted the discrepancy and states she will refill the whole prescription for a z-pack, with 500mg  the first day and 250 for every day following.  Called patient back and reviewed, she will return clal if she runs in to any more issues

## 2021-04-20 ENCOUNTER — Other Ambulatory Visit: Payer: Self-pay

## 2021-04-20 ENCOUNTER — Ambulatory Visit
Admission: EM | Admit: 2021-04-20 | Discharge: 2021-04-20 | Disposition: A | Discharge status: Home / Self Care | Payer: Medicaid Other | Attending: Emergency Medicine | Admitting: Emergency Medicine

## 2021-04-20 ENCOUNTER — Ambulatory Visit (INDEPENDENT_AMBULATORY_CARE_PROVIDER_SITE_OTHER): Payer: Medicaid Other

## 2021-04-20 DIAGNOSIS — R059 Cough, unspecified: Secondary | ICD-10-CM | POA: Diagnosis not present

## 2021-04-20 DIAGNOSIS — J40 Bronchitis, not specified as acute or chronic: Secondary | ICD-10-CM | POA: Diagnosis not present

## 2021-04-20 MED ORDER — PROMETHAZINE-DM 6.25-15 MG/5ML PO SYRP: 5.0000 mL | ORAL_SOLUTION | Freq: Four times a day (QID) | ORAL | 0 refills | Status: DC | PRN

## 2021-04-20 MED ORDER — IPRATROPIUM BROMIDE 0.06 % NA SOLN: 2.0000 | Freq: Four times a day (QID) | NASAL | 12 refills | Status: AC

## 2021-04-20 MED ORDER — ALBUTEROL SULFATE HFA 108 (90 BASE) MCG/ACT IN AERS: 1.0000 | INHALATION_SPRAY | Freq: Four times a day (QID) | RESPIRATORY_TRACT | 0 refills | Status: DC | PRN

## 2021-04-20 NOTE — Discharge Instructions (Addendum)
Use the albuterol inhaler/nebulizer every 4-6 hours as needed for shortness of breath, wheezing, and cough.  Use the Atrovent nasal spray, 2 squirts up each nostril every 6 hours, as needed for runny nose and nasal congestion.  Use the Tessalon Perles every 8 hours for your cough.  Taken with a small sip of water.  They may give you some numbness to the base of your tongue or metallic taste in her mouth, this is normal.  They are designed to calm down the cough reflex.  Use the Promethazine DM cough syrup at bedtime as will make you drowsy.  You may take 1 teaspoon (5 mL) every 6 hours.  Return for reevaluation for new or worsening symptoms.

## 2021-04-20 NOTE — ED Provider Notes (Addendum)
[name]    CSN: [id number] Arrival date & time: 04/20/21  1110      History   Chief Complaint Chief Complaint  Patient presents with   Cough    HPI Kara Cisneros is a 50 y.o. female.   HPI  50 year old female here for evaluation of respiratory complaints.  Patient reports that she has been experiencing a cough with shortness of breath and wheezing for the past month.  She is also experiencing runny nose, nasal congestion, sinus headache, and states her cough is productive for yellow sputum.  She was evaluated in this urgent care on 03/23/2021 and diagnosed with bronchitis.  She was discharged home on albuterol, Z-Pak, and prednisone.  She states she took the prednisone according to the package instructions but states that she still has prednisone left and she has been taking 10 mg tablet every morning so that she can breathe.  She is also continue to use her albuterol inhaler and Flonase, which was also prescribed at the last visit.  She denies fever or GI complaints.  Patient has a history of cigarette smoking and states that she still smokes marijuana.  Past Medical History:  Diagnosis Date   Anxiety    Arthritis    Depression    Hypertension    Plantar fascia syndrome     Patient Active Problem List   Diagnosis Date Noted   Laryngopharyngeal reflux (LPR) 07/18/2016   Thyroid nodule 07/18/2016   Anxiety and depression 05/20/2016   Abnormal finding on thyroid function test 07/20/2012   Acid reflux 07/20/2012   BP (high blood pressure) 07/20/2012   Multinodular goiter 07/20/2012    Past Surgical History:  Procedure Laterality Date   DILATION AND CURETTAGE OF UTERUS      OB History   No obstetric history on file.      Home Medications    Prior to Admission medications   Medication Sig Start Date End Date Taking? Authorizing Provider  citalopram (CELEXA) 10 MG tablet Take 10 mg by mouth daily. 04/12/20  Yes [provider]  esomeprazole (NEXIUM) 20 MG capsule Take by mouth daily. 07/07/18  Yes [provider]  fluticasone (FLONASE) 50 MCG/ACT nasal spray Place 2 sprays into both nostrils daily. 03/23/21  Yes Marney Setting, NP  gabapentin (NEURONTIN) 100 MG capsule Take 200 mg by mouth at bedtime. 06/01/18  Yes [provider]  ibuprofen (ADVIL,MOTRIN) 600 MG tablet  06/24/16  Yes [provider]  ipratropium (ATROVENT) 0.06 % nasal spray Place 2 sprays into both nostrils 4 (four) times daily. 04/20/21  Yes Margarette Canada, NP  lisinopril (PRINIVIL,ZESTRIL) 10 MG tablet Take 1 tablet (10 mg total) by mouth daily. Patient taking differently: Take 20 mg by mouth daily. 09/01/17 04/20/21 Yes Orbie Pyo, MD  promethazine-dextromethorphan (PROMETHAZINE-DM) 6.25-15 MG/5ML syrup Take 5 mLs by mouth 4 (four) times daily as needed. 04/20/21  Yes Margarette Canada, NP  propranolol (INDERAL) 40 MG tablet 40 mg. 04/29/17  Yes [provider]  traZODone (DESYREL) 50 MG tablet TAKE 1 2 TABLETS (50 100 MG TOTAL) BY MOUTH NIGHTLY. 07/13/18  Yes [provider]  albuterol (VENTOLIN HFA) 108 (90 Base) MCG/ACT inhaler Inhale 1-2 puffs into the lungs every 6 (six) hours as needed for wheezing or shortness of breath. 04/20/21   Margarette Canada, NP  hydrochlorothiazide (HYDRODIURIL) 25 MG tablet Take 0.5 tablets (12.5 mg total) daily by mouth. 02/24/17 03/01/19  [name], NP  omeprazole (PRILOSEC) 20 MG  capsule  04/16/17 03/01/19  [provider]    Family History Family History  Problem Relation Age of Onset   Hypertension Maternal Grandmother    Cancer Maternal Grandfather     Social History Social History   Tobacco Use   Smoking status: Every Day   Smokeless tobacco: Never   Tobacco comments:    puff or two everyday  Vaping Use   Vaping Use: Never used  Substance Use Topics   Alcohol use: Yes    Alcohol/week: 0.0 standard drinks    Comment: socially   Drug  use: No     Allergies   Patient has no known allergies.   Review of Systems Review of Systems  Constitutional:  Negative for activity change, appetite change and fever.  HENT:  Positive for congestion and rhinorrhea. Negative for ear pain.   Respiratory:  Positive for cough and wheezing. Negative for shortness of breath.   Gastrointestinal:  Negative for diarrhea, nausea and vomiting.  Skin:  Negative for rash.  Hematological: Negative.   Psychiatric/Behavioral: Negative.      Physical Exam Triage Vital Signs ED Triage Vitals  Enc Vitals Group     BP 04/20/21 1356 (!) 160/100     Pulse Rate 04/20/21 1356 81     Resp 04/20/21 1356 18     Temp 04/20/21 1356 98.3 F (36.8 C)     Temp Source 04/20/21 1356 Oral     SpO2 04/20/21 1356 97 %     Weight 04/20/21 1354 200 lb (90.7 kg)     Height 04/20/21 1354 5\' 7"  (1.702 m)     Head Circumference --      Peak Flow --      Pain Score --      Pain Loc --      Pain Edu? --      Excl. in [place]? --    No data found.  Updated Vital Signs BP (!) 160/100 (BP Location: Left Arm)    Pulse 81    Temp 98.3 F (36.8 C) (Oral)    Resp 18    Ht 5\' 7"  (1.702 m)    Wt 200 lb (90.7 kg)    LMP 02/08/2021    SpO2 97%    BMI 31.32 kg/m   Visual Acuity Right Eye Distance:   Left Eye Distance:   Bilateral Distance:    Right Eye Near:   Left Eye Near:    Bilateral Near:     Physical Exam Vitals and nursing note reviewed.  Constitutional:      General: She is not in acute distress.    Appearance: Normal appearance. She is not ill-appearing.  HENT:     Head: Normocephalic and atraumatic.     Right Ear: Tympanic membrane, ear canal and external ear normal. There is no impacted cerumen.     Left Ear: Tympanic membrane, ear canal and external ear normal. There is no impacted cerumen.     Nose: Congestion and rhinorrhea present.     Mouth/Throat:     Mouth: Mucous membranes are moist.     Pharynx: Oropharynx is clear. No posterior  oropharyngeal erythema.  Cardiovascular:     Rate and Rhythm: Normal rate and regular rhythm.     Pulses: Normal pulses.     Heart sounds: Normal heart sounds. No murmur heard.   No gallop.  Pulmonary:     Effort: Pulmonary effort is normal.     Breath sounds: Wheezing and rhonchi  present. No rales.  Musculoskeletal:     Cervical back: Normal range of motion and neck supple.  Lymphadenopathy:     Cervical: No cervical adenopathy.  Skin:    General: Skin is warm and dry.     Capillary Refill: Capillary refill takes less than 2 seconds.     Findings: No erythema or rash.  Neurological:     General: No focal deficit present.     Mental Status: She is alert and oriented to person, place, and time.  Psychiatric:        Mood and Affect: Mood normal.        Behavior: Behavior normal.        Thought Content: Thought content normal.        Judgment: Judgment normal.     UC Treatments / Results  Labs (all labs ordered are listed, but only abnormal results are displayed) Labs Reviewed - No data to display  EKG   Radiology DG Chest 2 View  Result Date: 04/20/2021 CLINICAL DATA:  Cough for 1 month. EXAM: CHEST - 2 VIEW COMPARISON:  None. FINDINGS: The heart size and mediastinal contours are within normal limits. Both lungs are clear. The visualized skeletal structures are unremarkable. IMPRESSION: No active cardiopulmonary disease. Electronically Signed   By: Abelardo Diesel M.D.   On: 04/20/2021 15:11    Procedures Procedures (including critical care time)  Medications Ordered in UC Medications - No data to display  Initial Impression / Assessment and Plan / UC Course  I have reviewed the triage vital signs and the nursing notes.  Pertinent labs & imaging results that were available during my care of the patient were reviewed by me and considered in my medical decision making (see chart for details).  Patient is a nontoxic-appearing 50 year old female here for evaluation of  continued cough that is been ongoing for over a month.  She states she has a frequent history of bronchitis.  She is a smoker.  She reports that she is continuing to use her albuterol inhaler that was prescribed at the last visit, take the Flonase nasal spray, and still has prednisone left over from her 6-day prednisone taper.  She states that she has been taking 10 mg of prednisone every day so that she can breathe.  On physical exam patient has pearly gray tympanic membranes bilaterally with normal light reflex and clear external auditory canals.  Nasal mucosa is erythematous and edematous with clear discharge in both nares.  No tenderness to percussion of frontal or maxillary sinuses.  Oropharyngeal exam is benign.  No cervical lymphadenopathy appreciated exam.  Cardiopulmonary exam reveals wheezes and rhonchi in bilateral middle and upper lobes.  These adventitious sounds did clear with cough.  My concern is that patient is continuing to have symptoms that have lasted longer than a month.  Will obtain chest x-ray to look for acute cardiopulmonary process given her smoking history.  Chest x-ray independently reviewed and evaluated by me.  Impression: Lung spaces are well pneumatized.  There are no opacities or infiltrates visible.  Radiology overread is pending. Radiology impression is no active cardiopulmonary disease.  Chest x-ray is unremarkable though patient's lung sounds are consistent with continued bronchitis.  I am going to refill her albuterol inhaler, prescribe Tessalon Perles, Atrovent nasal spray, and Promethazine DM cough syrup help with patient's symptoms.  Final Clinical Impressions(s) / UC Diagnoses   Final diagnoses:  Bronchitis     Discharge Instructions      Use  the albuterol inhaler/nebulizer every 4-6 hours as needed for shortness of breath, wheezing, and cough.  Use the Atrovent nasal spray, 2 squirts up each nostril every 6 hours, as needed for runny nose and nasal  congestion.  Use the Tessalon Perles every 8 hours for your cough.  Taken with a small sip of water.  They may give you some numbness to the base of your tongue or metallic taste in her mouth, this is normal.  They are designed to calm down the cough reflex.  Use the Promethazine DM cough syrup at bedtime as will make you drowsy.  You may take 1 teaspoon (5 mL) every 6 hours.  Return for reevaluation for new or worsening symptoms.      ED Prescriptions     Medication Sig Dispense Auth. Provider   albuterol (VENTOLIN HFA) 108 (90 Base) MCG/ACT inhaler Inhale 1-2 puffs into the lungs every 6 (six) hours as needed for wheezing or shortness of breath. 90 g Margarette Canada, NP   ipratropium (ATROVENT) 0.06 % nasal spray Place 2 sprays into both nostrils 4 (four) times daily. 15 mL Margarette Canada, NP   promethazine-dextromethorphan (PROMETHAZINE-DM) 6.25-15 MG/5ML syrup Take 5 mLs by mouth 4 (four) times daily as needed. 118 mL Margarette Canada, NP      PDMP not reviewed this encounter.   Margarette Canada, NP 04/20/21 1525    Margarette Canada, NP 04/20/21 [id number]

## 2021-04-20 NOTE — ED Triage Notes (Signed)
Pt here with C/O cough, was here on 03/23/2021, pt states she is still not any better. Pt is still taking prednisone.

## 2022-03-25 ENCOUNTER — Ambulatory Visit
Admission: EM | Admit: 2022-03-25 | Discharge: 2022-03-25 | Disposition: A | Discharge status: Home / Self Care | Payer: Medicaid Other

## 2022-03-25 ENCOUNTER — Encounter: Payer: Self-pay | Admitting: Emergency Medicine

## 2022-03-25 DIAGNOSIS — H6591 Unspecified nonsuppurative otitis media, right ear: Secondary | ICD-10-CM

## 2022-03-25 DIAGNOSIS — J40 Bronchitis, not specified as acute or chronic: Secondary | ICD-10-CM

## 2022-03-25 DIAGNOSIS — J302 Other seasonal allergic rhinitis: Secondary | ICD-10-CM

## 2022-03-25 MED ORDER — PROMETHAZINE-DM 6.25-15 MG/5ML PO SYRP: 5.0000 mL | ORAL_SOLUTION | Freq: Four times a day (QID) | ORAL | 0 refills | Status: AC | PRN

## 2022-03-25 MED ORDER — PREDNISONE 20 MG PO TABS: 40.0000 mg | ORAL_TABLET | Freq: Every day | ORAL | 0 refills | Status: AC

## 2022-03-25 MED ORDER — BENZONATATE 100 MG PO CAPS: 100.0000 mg | ORAL_CAPSULE | Freq: Three times a day (TID) | ORAL | 0 refills | Status: AC

## 2022-03-25 MED ORDER — ALBUTEROL SULFATE HFA 108 (90 BASE) MCG/ACT IN AERS: 2.0000 | INHALATION_SPRAY | Freq: Four times a day (QID) | RESPIRATORY_TRACT | 1 refills | Status: AC | PRN

## 2022-03-25 NOTE — Discharge Instructions (Addendum)
-  Prednisone: 2 tablets twice a day for 5 days -Tessalon Perles 1 tablet every 8 hours as needed for cough -Promethazine DM: Recommend using this at night as it will make you sleepy. -Albuterol: 2 puffs as needed for cough not improved with oral medication or for any coughing fits with wheezing or shortness of breath -For the congestion and seasonal allergy symptoms would recommend Flonase to both nostrils in the morning and at night and oral allergy medication (Claritin, Allegra, Zyrtec) -Would recommend avoiding smoking with the cough as the smoke can irritate the lungs and worsen the cough -Follow with primary care this clinic as needed should symptoms worsen or not improve

## 2022-03-25 NOTE — ED Triage Notes (Signed)
Pt c/o cough, runny nose. Started the day after thanksgiving. Denies feveror body aches.

## 2022-03-25 NOTE — ED Provider Notes (Signed)
[name]    CSN: [id number] Arrival date & time: 03/25/22  0816      History   Chief Complaint Chief Complaint  Patient presents with   Cough    HPI Kara Cisneros is a 51 y.o. female.   Patient is a 51 year old female who presents with chief complaint of cough and runny nose that started after Thanksgiving.  Patient denies any fever or bodyaches.  Patient also reports some clear nasal drainage but she is taking over-the-counter nasal spray for.  Patient also reports history of GERD states she did have some alcohol at Thanksgiving but does not believe she was still have symptoms from this.  Patient reports some intermittent mucus production that appears junky.  She reports she has had coughing issues over this last few winters.  That she had no symptoms or other issues before the cough sent in.  Patient denies any chest pain or shortness of breath.  She states she is still taking the albuterol inhaler that she got last year as well as Tums and Tylenol Sinus.  Patient states that she does smoke marijuana once or twice a week but states she does that year-round so does not believe it is related to this issue.    Past Medical History:  Diagnosis Date   Anxiety    Arthritis    Depression    Hypertension    Plantar fascia syndrome     Patient Active Problem List   Diagnosis Date Noted   Laryngopharyngeal reflux (LPR) 07/18/2016   Thyroid nodule 07/18/2016   Anxiety and depression 05/20/2016   Abnormal finding on thyroid function test 07/20/2012   Acid reflux 07/20/2012   BP (high blood pressure) 07/20/2012   Multinodular goiter 07/20/2012    Past Surgical History:  Procedure Laterality Date   DILATION AND CURETTAGE OF UTERUS      OB History   No obstetric history on file.      Home Medications    Prior to Admission medications   Medication Sig Start Date End Date Taking? Authorizing Provider  albuterol (VENTOLIN HFA) 108 (90 Base)  MCG/ACT inhaler Inhale 2 puffs into the lungs every 6 (six) hours as needed for wheezing or shortness of breath. 03/25/22  Yes Luvenia Redden, PA-C  benzonatate (TESSALON) 100 MG capsule Take 1 capsule (100 mg total) by mouth every 8 (eight) hours. 03/25/22  Yes Luvenia Redden, PA-C  esomeprazole (NEXIUM) 20 MG capsule Take by mouth daily. 07/07/18  Yes [provider]  lisinopril (PRINIVIL,ZESTRIL) 10 MG tablet Take 1 tablet (10 mg total) by mouth daily. Patient taking differently: Take 20 mg by mouth daily. 09/01/17 03/25/22 Yes Schaevitz, Randall An, MD  predniSONE (DELTASONE) 20 MG tablet Take 2 tablets (40 mg total) by mouth daily for 5 days. 03/25/22 03/30/22 Yes Luvenia Redden, PA-C  promethazine-dextromethorphan (PROMETHAZINE-DM) 6.25-15 MG/5ML syrup Take 5 mLs by mouth 4 (four) times daily as needed for cough. 03/25/22  Yes Luvenia Redden, PA-C  propranolol (INDERAL) 40 MG tablet 40 mg. 04/29/17  Yes [provider]  traZODone (DESYREL) 50 MG tablet TAKE 1 2 TABLETS (50 100 MG TOTAL) BY MOUTH NIGHTLY. 07/13/18  Yes [provider]  fluticasone (FLONASE) 50 MCG/ACT nasal spray Place 2 sprays into both nostrils daily. 03/23/21   Marney Setting, NP  ibuprofen (ADVIL,MOTRIN) 600 MG tablet  06/24/16   [provider]  ipratropium (ATROVENT) 0.06 % nasal spray Place 2 sprays into both nostrils 4 (four)  times daily. 04/20/21   Margarette Canada, NP  montelukast (SINGULAIR) 10 MG tablet Take 10 mg by mouth every morning.    [provider]  hydrochlorothiazide (HYDRODIURIL) 25 MG tablet Take 0.5 tablets (12.5 mg total) daily by mouth. 02/24/17 03/01/19  [name], NP  omeprazole (PRILOSEC) 20 MG capsule  04/16/17 03/01/19  [provider]    Family History Family History  Problem Relation Age of Onset   Hypertension Maternal Grandmother    Cancer Maternal Grandfather     Social History Social History   Tobacco Use   Smoking  status: Some Days    Types: Cigarettes   Smokeless tobacco: Never   Tobacco comments:    puff or two everyday  Vaping Use   Vaping Use: Never used  Substance Use Topics   Alcohol use: Yes    Alcohol/week: 0.0 standard drinks of alcohol    Comment: socially   Drug use: No     Allergies   Patient has no known allergies.   Review of Systems Review of Systems as noted above in HPI.  Other systems reviewed and found to be negative   Physical Exam Triage Vital Signs ED Triage Vitals  Enc Vitals Group     BP 03/25/22 0832 (!) 152/102     Pulse Rate 03/25/22 0832 68     Resp 03/25/22 0832 16     Temp 03/25/22 0832 98.5 F (36.9 C)     Temp Source 03/25/22 0832 Oral     SpO2 03/25/22 0832 95 %     Weight 03/25/22 0829 199 lb 15.3 oz (90.7 kg)     Height 03/25/22 0829 '5\' 7"'$  (1.702 m)     Head Circumference --      Peak Flow --      Pain Score 03/25/22 0828 0     Pain Loc --      Pain Edu? --      Excl. in [place]? --    No data found.  Updated Vital Signs BP (!) 152/102 (BP Location: Right Arm)   Pulse 68   Temp 98.5 F (36.9 C) (Oral)   Resp 16   Ht '5\' 7"'$  (1.702 m)   Wt 199 lb 15.3 oz (90.7 kg)   SpO2 95%   BMI 31.32 kg/m     Physical Exam Constitutional:      Appearance: Normal appearance.  HENT:     Right Ear: A middle ear effusion is present. Tympanic membrane is not erythematous.     Left Ear: Tympanic membrane normal.  No middle ear effusion. Tympanic membrane is not erythematous.     Nose:     Right Sinus: No maxillary sinus tenderness or frontal sinus tenderness.     Left Sinus: No maxillary sinus tenderness or frontal sinus tenderness.     Mouth/Throat:     Mouth: Mucous membranes are moist.     Pharynx: Oropharynx is clear. Uvula midline.     Tonsils: 0 on the right. 0 on the left.  Cardiovascular:     Rate and Rhythm: Normal rate and regular rhythm.  Pulmonary:     Effort: Pulmonary effort is normal.     Breath sounds: Normal breath sounds. No  rhonchi.     Comments: Wheez and cough with forced expiration Neurological:     Mental Status: She is alert.      UC Treatments / Results  Labs (all labs ordered are listed, but only abnormal results are displayed) Labs  Reviewed - No data to display  EKG   Radiology No results found.  Procedures Procedures (including critical care time)  Medications Ordered in UC Medications - No data to display  Initial Impression / Assessment and Plan / UC Course  I have reviewed the triage vital signs and the nursing notes.  Pertinent labs & imaging results that were available during my care of the patient were reviewed by me and considered in my medical decision making (see chart for details).    Patient presents with persistent cough starting the day after Thanksgiving.  She had similar episode last year that did improve with prednisone as well as other medications she was prescribed.  Patient also has some nasal congestion and runny nose.  Middle ear effusion on the right without any signs of overt infection.  Cough and wheeze with forced expiration.  Symptoms likely seasonal allergies/cold for the nasal congestion.  Cough most likely bronchitis given the reactive airway.  Her lisinopril could also play a part to have her follow-up with her primary care as well.  Give her prescription for prednisone and have her albuterol refilled.  Also give her Tessalon.  Will give her Promethazine DM to help her with sleeping at night. Final Clinical Impressions(s) / UC Diagnoses   Final diagnoses:  Bronchitis  Seasonal allergies  Fluid level behind tympanic membrane of right ear     Discharge Instructions      -Prednisone: 2 tablets twice a day for 5 days -Tessalon Perles 1 tablet every 8 hours as needed for cough -Promethazine DM: Recommend using this at night as it will make you sleepy. -Albuterol: 2 puffs as needed for cough not improved with oral medication or for any coughing fits with  wheezing or shortness of breath -For the congestion and seasonal allergy symptoms would recommend Flonase to both nostrils in the morning and at night and oral allergy medication (Claritin, Allegra, Zyrtec) -Would recommend avoiding smoking with the cough as the smoke can irritate the lungs and worsen the cough -Follow with primary care this clinic as needed should symptoms worsen or not improve     ED Prescriptions     Medication Sig Dispense Auth. Provider   predniSONE (DELTASONE) 20 MG tablet Take 2 tablets (40 mg total) by mouth daily for 5 days. 10 tablet Luvenia Redden, PA-C   albuterol (VENTOLIN HFA) 108 (90 Base) MCG/ACT inhaler Inhale 2 puffs into the lungs every 6 (six) hours as needed for wheezing or shortness of breath. 18 g Luvenia Redden, PA-C   promethazine-dextromethorphan (PROMETHAZINE-DM) 6.25-15 MG/5ML syrup Take 5 mLs by mouth 4 (four) times daily as needed for cough. 118 mL Luvenia Redden, PA-C   benzonatate (TESSALON) 100 MG capsule Take 1 capsule (100 mg total) by mouth every 8 (eight) hours. 21 capsule Luvenia Redden, PA-C      PDMP not reviewed this encounter.   Luvenia Redden, PA-C 03/25/22 [phone]

## 2022-05-19 IMAGING — CR DG CHEST 2V
3 series · 3 of 3 positions shown · non-contrast
Comparison: None.

CLINICAL DATA: Cough for 1 month.

EXAM:
CHEST - 2 VIEW

[chest pa (1 of 2)]
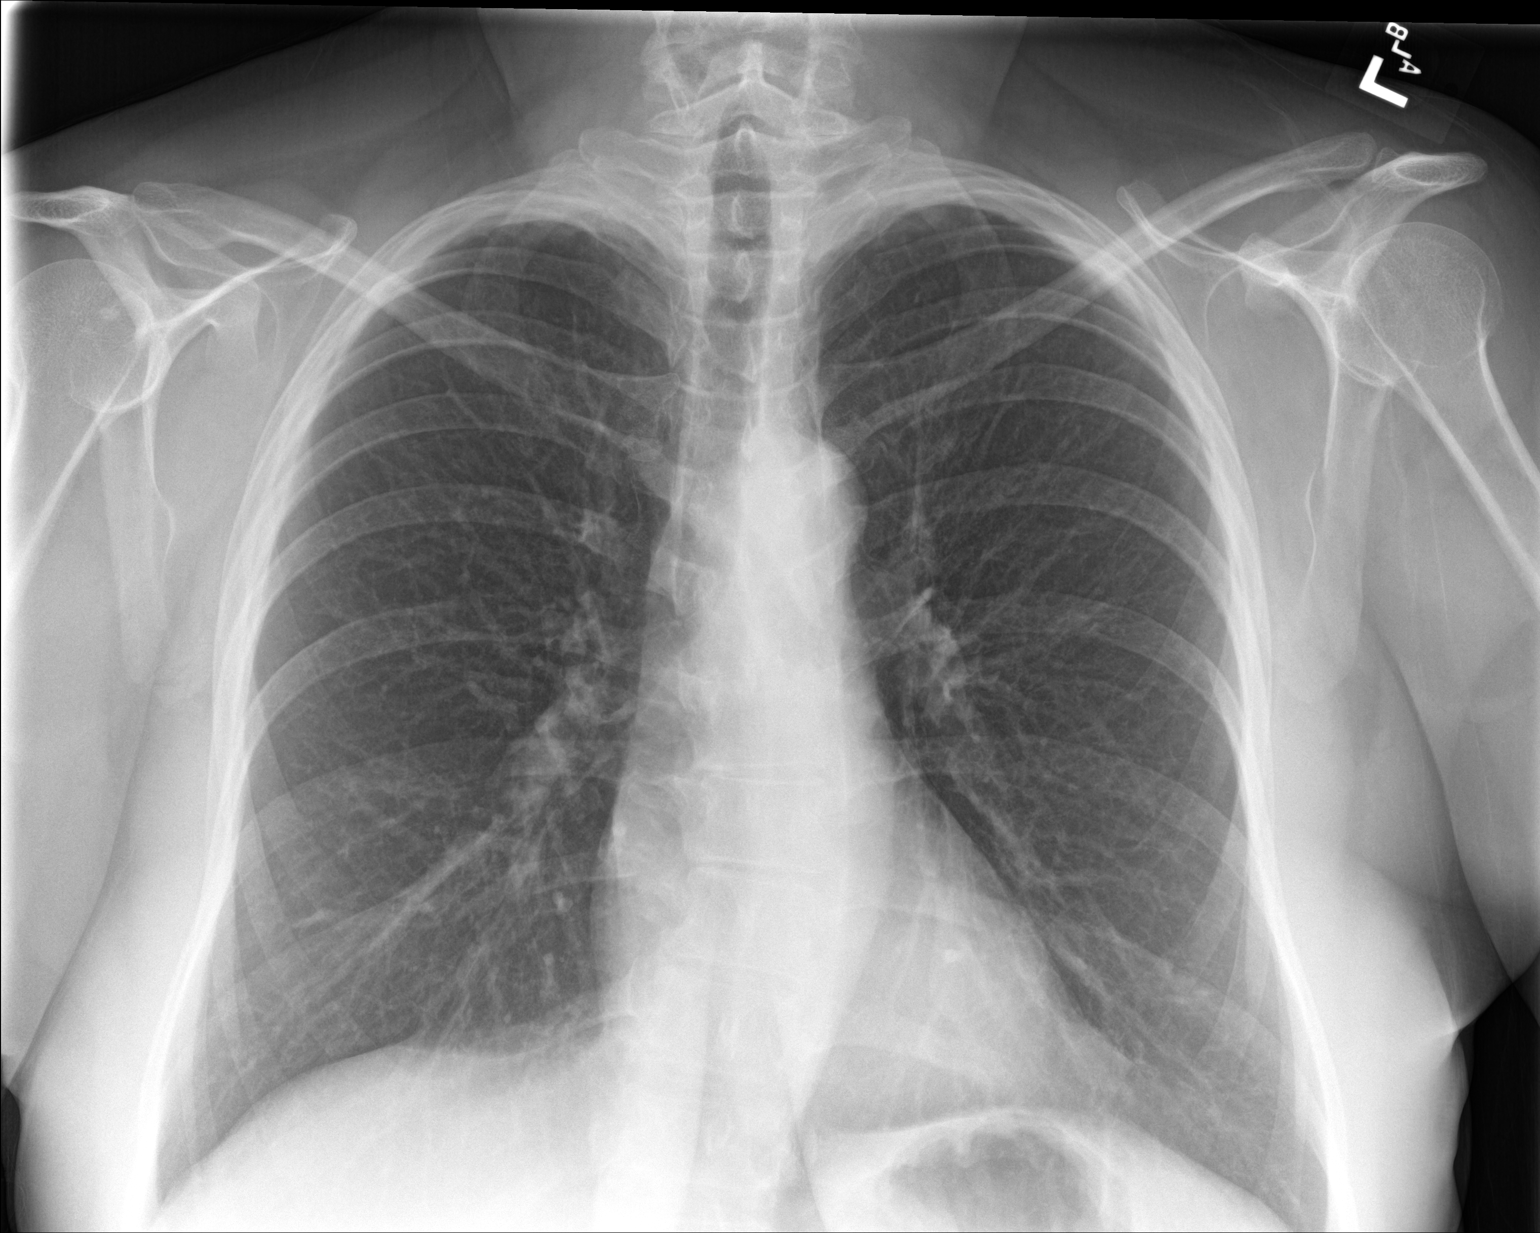

[chest lat]
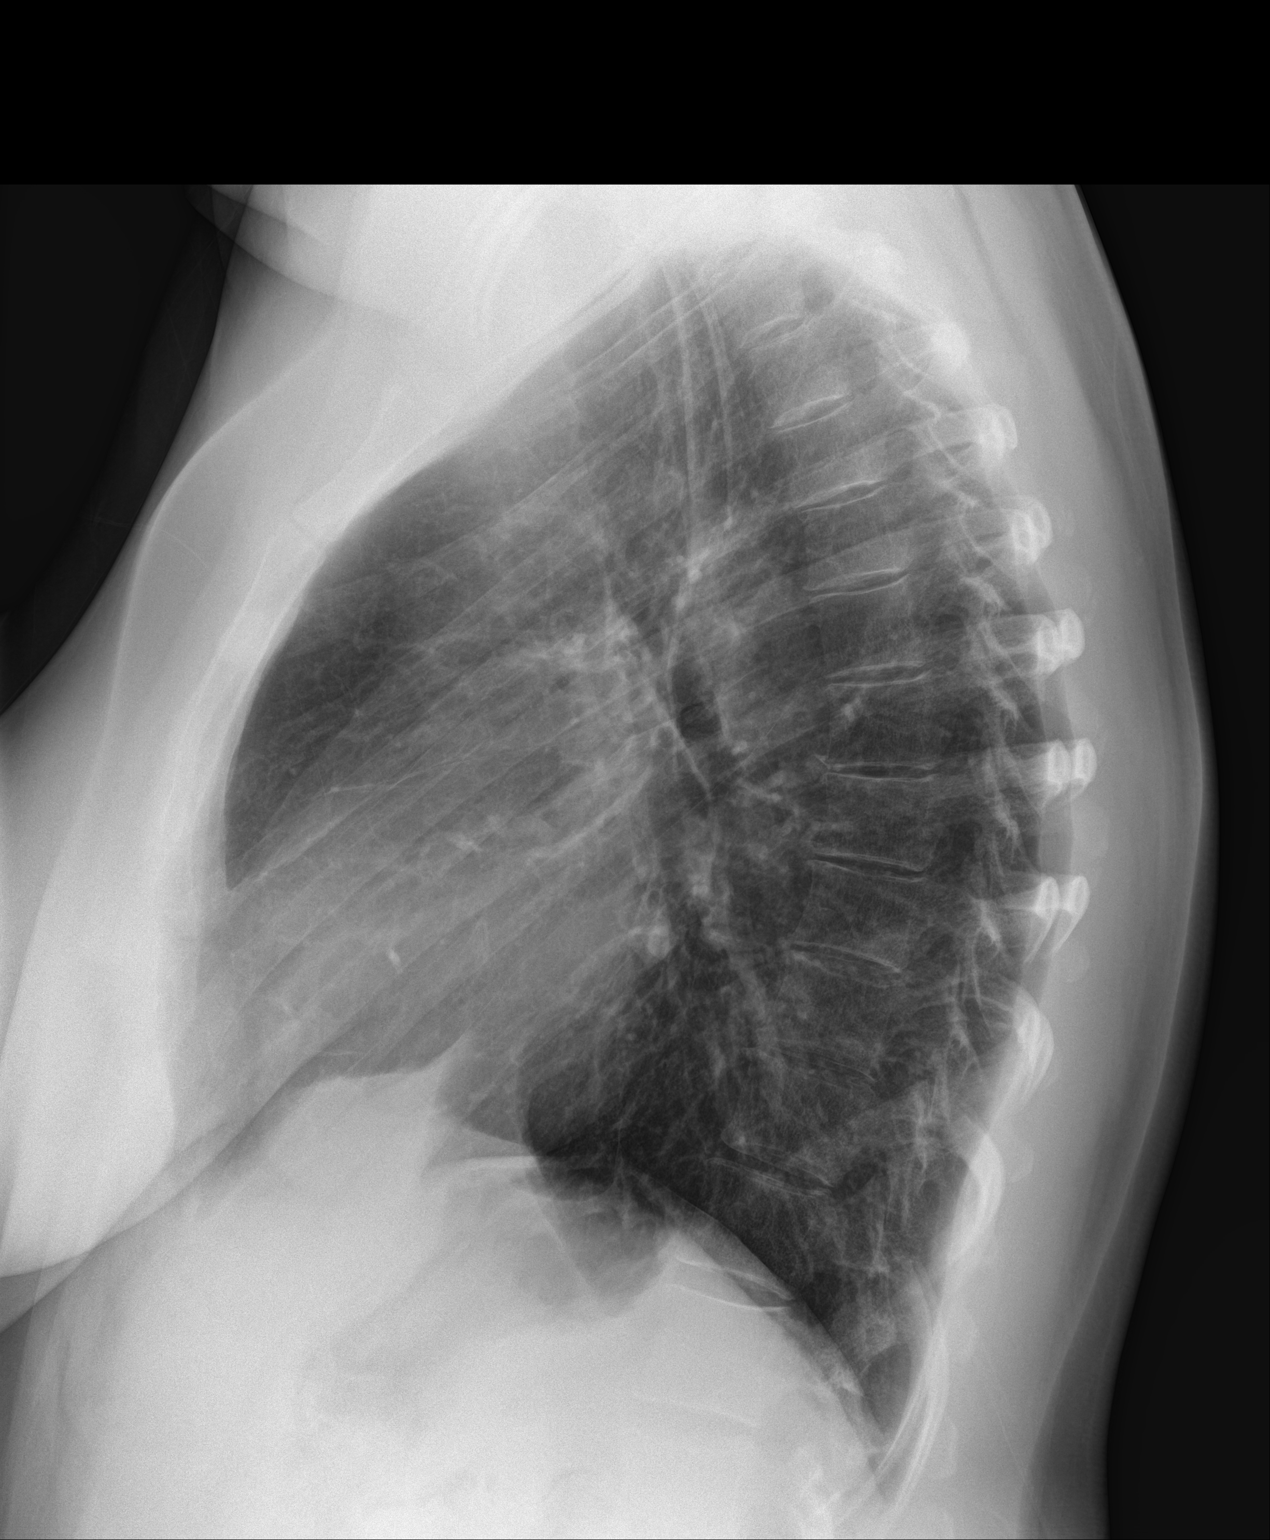

[chest pa (2 of 2)]
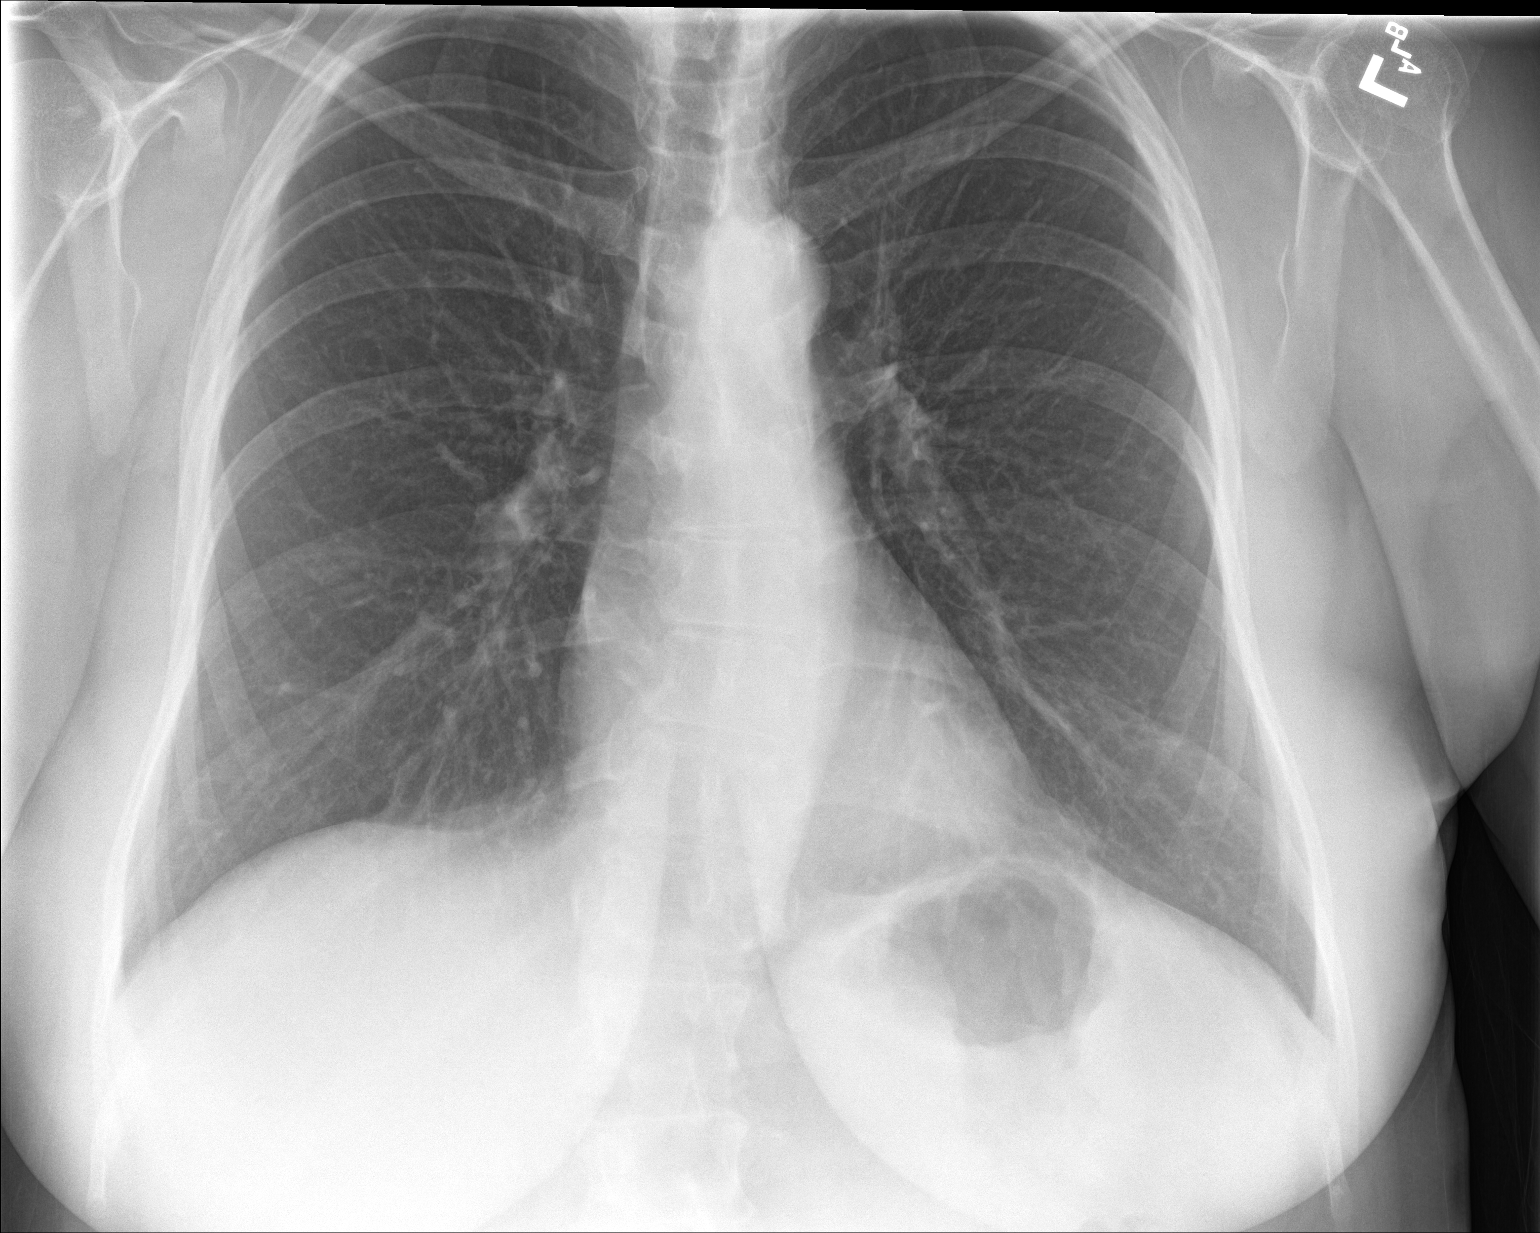

[3 of 3 positions shown; findings below may reference images not displayed]

FINDINGS: The heart size and mediastinal contours are within normal limits.
Both lungs are clear. The visualized skeletal structures are
unremarkable.
IMPRESSION: No active cardiopulmonary disease.
